# Patient Record
Sex: Female | Born: 1955 | Race: White | Hispanic: No | Marital: Married | Smoking: Never smoker
Health system: Southern US, Community
[De-identification: ages and names within clinical notes are randomized; demographics above are authoritative.]

## PROBLEM LIST (undated history)

## (undated) DIAGNOSIS — N6019 Diffuse cystic mastopathy of unspecified breast: Secondary | ICD-10-CM

## (undated) DIAGNOSIS — Z780 Asymptomatic menopausal state: Secondary | ICD-10-CM

## (undated) DIAGNOSIS — IMO0002 Reserved for concepts with insufficient information to code with codable children: Secondary | ICD-10-CM

## (undated) DIAGNOSIS — N6012 Diffuse cystic mastopathy of left breast: Secondary | ICD-10-CM

## (undated) DIAGNOSIS — I10 Essential (primary) hypertension: Secondary | ICD-10-CM

## (undated) DIAGNOSIS — M75100 Unspecified rotator cuff tear or rupture of unspecified shoulder, not specified as traumatic: Secondary | ICD-10-CM

## (undated) DIAGNOSIS — N950 Postmenopausal bleeding: Secondary | ICD-10-CM

## (undated) DIAGNOSIS — K5190 Ulcerative colitis, unspecified, without complications: Secondary | ICD-10-CM

## (undated) DIAGNOSIS — E785 Hyperlipidemia, unspecified: Secondary | ICD-10-CM

## (undated) DIAGNOSIS — R030 Elevated blood-pressure reading, without diagnosis of hypertension: Secondary | ICD-10-CM

## (undated) HISTORY — DX: Diffuse cystic mastopathy of unspecified breast: N60.19

## (undated) HISTORY — DX: Hyperlipidemia, unspecified: E78.5

## (undated) HISTORY — DX: Diffuse cystic mastopathy of left breast: N60.12

## (undated) HISTORY — DX: Reserved for concepts with insufficient information to code with codable children: IMO0002

## (undated) HISTORY — DX: Ulcerative colitis, unspecified, without complications: K51.90

## (undated) HISTORY — PX: BREAST SURGERY: SHX581

## (undated) HISTORY — DX: Elevated blood-pressure reading, without diagnosis of hypertension: R03.0

## (undated) HISTORY — PX: BREAST CYST ASPIRATION: SHX578

## (undated) HISTORY — PX: BREAST BIOPSY: SHX20

---

## 1997-08-04 ENCOUNTER — Other Ambulatory Visit: Admission: RE | Admit: 1997-08-04 | Discharge: 1997-08-04 | Payer: Self-pay | Admitting: Dermatology

## 1998-01-29 HISTORY — PX: OTHER SURGICAL HISTORY: SHX169

## 1998-08-09 ENCOUNTER — Other Ambulatory Visit: Admission: RE | Admit: 1998-08-09 | Discharge: 1998-08-09 | Payer: Self-pay | Admitting: Gynecology

## 1999-04-28 ENCOUNTER — Ambulatory Visit (HOSPITAL_COMMUNITY): Admission: RE | Admit: 1999-04-28 | Discharge: 1999-04-28 | Payer: Self-pay | Admitting: Gynecology

## 1999-04-28 ENCOUNTER — Encounter (INDEPENDENT_AMBULATORY_CARE_PROVIDER_SITE_OTHER): Payer: Self-pay | Admitting: Specialist

## 1999-05-18 ENCOUNTER — Other Ambulatory Visit: Admission: RE | Admit: 1999-05-18 | Discharge: 1999-05-18 | Payer: Self-pay | Admitting: Gynecology

## 1999-12-12 ENCOUNTER — Other Ambulatory Visit: Admission: RE | Admit: 1999-12-12 | Discharge: 1999-12-12 | Payer: Self-pay | Admitting: Gynecology

## 2000-11-27 ENCOUNTER — Other Ambulatory Visit: Admission: RE | Admit: 2000-11-27 | Discharge: 2000-11-27 | Payer: Self-pay | Admitting: Gynecology

## 2000-12-04 ENCOUNTER — Encounter: Payer: Self-pay | Admitting: Gynecology

## 2000-12-04 ENCOUNTER — Encounter: Admission: RE | Admit: 2000-12-04 | Discharge: 2000-12-04 | Payer: Self-pay | Admitting: Gynecology

## 2001-12-08 ENCOUNTER — Encounter: Payer: Self-pay | Admitting: Gynecology

## 2001-12-08 ENCOUNTER — Encounter: Admission: RE | Admit: 2001-12-08 | Discharge: 2001-12-08 | Payer: Self-pay | Admitting: Gynecology

## 2001-12-31 ENCOUNTER — Other Ambulatory Visit: Admission: RE | Admit: 2001-12-31 | Discharge: 2001-12-31 | Payer: Self-pay | Admitting: Gynecology

## 2002-09-16 ENCOUNTER — Ambulatory Visit (HOSPITAL_COMMUNITY): Admission: RE | Admit: 2002-09-16 | Discharge: 2002-09-16 | Payer: Self-pay | Admitting: Gastroenterology

## 2002-09-16 ENCOUNTER — Encounter (INDEPENDENT_AMBULATORY_CARE_PROVIDER_SITE_OTHER): Payer: Self-pay | Admitting: *Deleted

## 2002-11-06 ENCOUNTER — Other Ambulatory Visit: Admission: RE | Admit: 2002-11-06 | Discharge: 2002-11-06 | Payer: Self-pay | Admitting: Gynecology

## 2002-11-27 ENCOUNTER — Encounter: Admission: RE | Admit: 2002-11-27 | Discharge: 2002-11-27 | Payer: Self-pay | Admitting: Gynecology

## 2003-03-01 ENCOUNTER — Other Ambulatory Visit: Admission: RE | Admit: 2003-03-01 | Discharge: 2003-03-01 | Payer: Self-pay | Admitting: Gynecology

## 2003-12-14 ENCOUNTER — Encounter: Admission: RE | Admit: 2003-12-14 | Discharge: 2003-12-14 | Payer: Self-pay | Admitting: Gynecology

## 2004-03-21 ENCOUNTER — Other Ambulatory Visit: Admission: RE | Admit: 2004-03-21 | Discharge: 2004-03-21 | Payer: Self-pay | Admitting: Gynecology

## 2004-05-09 ENCOUNTER — Other Ambulatory Visit: Admission: RE | Admit: 2004-05-09 | Discharge: 2004-05-09 | Payer: Self-pay | Admitting: Gynecology

## 2004-09-12 ENCOUNTER — Other Ambulatory Visit: Admission: RE | Admit: 2004-09-12 | Discharge: 2004-09-12 | Payer: Self-pay | Admitting: Gynecology

## 2005-01-31 ENCOUNTER — Encounter: Admission: RE | Admit: 2005-01-31 | Discharge: 2005-01-31 | Payer: Self-pay | Admitting: Gynecology

## 2005-02-21 ENCOUNTER — Encounter: Admission: RE | Admit: 2005-02-21 | Discharge: 2005-02-21 | Payer: Self-pay | Admitting: Gynecology

## 2005-04-13 ENCOUNTER — Other Ambulatory Visit: Admission: RE | Admit: 2005-04-13 | Discharge: 2005-04-13 | Payer: Self-pay | Admitting: Gynecology

## 2006-04-30 ENCOUNTER — Encounter: Admission: RE | Admit: 2006-04-30 | Discharge: 2006-04-30 | Payer: Self-pay | Admitting: Gynecology

## 2006-08-23 ENCOUNTER — Other Ambulatory Visit: Admission: RE | Admit: 2006-08-23 | Discharge: 2006-08-23 | Payer: Self-pay | Admitting: Gynecology

## 2006-10-07 ENCOUNTER — Encounter: Admission: RE | Admit: 2006-10-07 | Discharge: 2006-10-07 | Payer: Self-pay | Admitting: Gynecology

## 2006-10-31 ENCOUNTER — Other Ambulatory Visit: Admission: RE | Admit: 2006-10-31 | Discharge: 2006-10-31 | Payer: Self-pay | Admitting: Gynecology

## 2007-07-25 ENCOUNTER — Encounter (INDEPENDENT_AMBULATORY_CARE_PROVIDER_SITE_OTHER): Payer: Self-pay | Admitting: Diagnostic Radiology

## 2007-07-25 ENCOUNTER — Encounter: Admission: RE | Admit: 2007-07-25 | Discharge: 2007-07-25 | Payer: Self-pay | Admitting: Gynecology

## 2008-02-26 ENCOUNTER — Ambulatory Visit: Payer: Self-pay | Admitting: Internal Medicine

## 2008-10-20 ENCOUNTER — Encounter: Admission: RE | Admit: 2008-10-20 | Discharge: 2008-10-20 | Payer: Self-pay | Admitting: Gynecology

## 2009-08-05 ENCOUNTER — Ambulatory Visit: Payer: Self-pay | Admitting: Unknown Physician Specialty

## 2010-02-15 ENCOUNTER — Encounter
Admission: RE | Admit: 2010-02-15 | Discharge: 2010-02-15 | Payer: Self-pay | Source: Home / Self Care | Attending: Gynecology | Admitting: Gynecology

## 2010-02-19 ENCOUNTER — Encounter: Payer: Self-pay | Admitting: Gynecology

## 2010-06-16 NOTE — Op Note (Signed)
   NAME:  Victoria Shelton, Victoria Shelton                            ACCOUNT NO.:  [id number]   MEDICAL RECORD NO.:  [id number]                   PATIENT TYPE:  AMB   LOCATION:  ENDO                                 FACILITY:  [facility]   PHYSICIAN:  Graylin Shiver, M.D.                DATE OF BIRTH:  Sep 07, 1955   DATE OF PROCEDURE:  09/16/2002  DATE OF DISCHARGE:                                 OPERATIVE REPORT   PROCEDURE:  Colonoscopy with biopsy.   INDICATIONS FOR PROCEDURE:  Change in bowel habits, rectal bleeding.   Informed consent was obtained after explanation of the risks of bleeding,  infection and perforation.   PREMEDICATION:  Fentanyl 62.5 mcg IV, Versed 5 mg IV.   DESCRIPTION OF PROCEDURE:  With the patient in the left lateral decubitus  position, a rectal exam was performed and no masses were felt. The Olympus  colonoscope was then inserted into the rectum and advanced around the colon  to the cecum. Cecal landmarks were identified. The cecum and ascending colon  were normal. The transverse colon was normal.  The descending colon was  normal. In the distal sigmoid, there was a small 3 mm sessile polyp removed  with cold forceps. In the rectum extending up approximately 8 cm of the  distal rectum was inflammation and ulceration compatible with proctitis,  biopsies were obtained. The patient tolerated the procedure well without  complications.   IMPRESSION:  1. Ulcerative proctitis.  2. Small sigmoid polyp.   PLAN:  The biopsies will be checked. The patient will be given a  prescription for Canasa suppositories, one b.i.d. and asked to followup in  the office to see how she is doing clinically in the next few weeks.                                               Graylin Shiver, M.D.    Germain Osgood  D:  09/16/2002  T:  09/16/2002  Job:  [id number]   cc:   Gretta Cool, M.D.  [address] [place]  [place] [address]  Fax: [phone]

## 2010-07-12 ENCOUNTER — Other Ambulatory Visit: Payer: Self-pay | Admitting: Gynecology

## 2011-03-23 ENCOUNTER — Other Ambulatory Visit: Payer: Self-pay | Admitting: Gynecology

## 2011-03-23 DIAGNOSIS — N63 Unspecified lump in unspecified breast: Secondary | ICD-10-CM

## 2011-04-12 ENCOUNTER — Ambulatory Visit
Admission: RE | Admit: 2011-04-12 | Discharge: 2011-04-12 | Disposition: A | Discharge status: Home / Self Care | Payer: Managed Care, Other (non HMO) | Source: Ambulatory Visit | Attending: Gynecology | Admitting: Gynecology

## 2011-04-12 ENCOUNTER — Other Ambulatory Visit: Payer: Self-pay | Admitting: Gynecology

## 2011-04-12 DIAGNOSIS — N63 Unspecified lump in unspecified breast: Secondary | ICD-10-CM

## 2011-09-26 ENCOUNTER — Other Ambulatory Visit: Payer: Self-pay | Admitting: Gynecology

## 2012-07-15 ENCOUNTER — Other Ambulatory Visit: Payer: Self-pay

## 2012-07-15 DIAGNOSIS — Z1231 Encounter for screening mammogram for malignant neoplasm of breast: Secondary | ICD-10-CM

## 2012-08-13 ENCOUNTER — Ambulatory Visit
Admission: RE | Admit: 2012-08-13 | Discharge: 2012-08-13 | Disposition: A | Discharge status: Home / Self Care | Payer: Managed Care, Other (non HMO) | Source: Ambulatory Visit

## 2012-08-13 DIAGNOSIS — Z1231 Encounter for screening mammogram for malignant neoplasm of breast: Secondary | ICD-10-CM

## 2012-12-11 LAB — HM PAP SMEAR: HM Pap smear: NEGATIVE

## 2013-10-07 ENCOUNTER — Other Ambulatory Visit: Payer: Self-pay

## 2013-10-07 DIAGNOSIS — Z1231 Encounter for screening mammogram for malignant neoplasm of breast: Secondary | ICD-10-CM

## 2013-10-19 ENCOUNTER — Ambulatory Visit: Payer: Managed Care, Other (non HMO)

## 2013-11-05 ENCOUNTER — Ambulatory Visit
Admission: RE | Admit: 2013-11-05 | Discharge: 2013-11-05 | Disposition: A | Discharge status: Home / Self Care | Payer: Managed Care, Other (non HMO) | Source: Ambulatory Visit

## 2013-11-05 DIAGNOSIS — Z1231 Encounter for screening mammogram for malignant neoplasm of breast: Secondary | ICD-10-CM

## 2013-11-10 ENCOUNTER — Other Ambulatory Visit: Payer: Self-pay | Admitting: Internal Medicine

## 2013-11-10 DIAGNOSIS — R928 Other abnormal and inconclusive findings on diagnostic imaging of breast: Secondary | ICD-10-CM

## 2013-11-23 ENCOUNTER — Ambulatory Visit
Admission: RE | Admit: 2013-11-23 | Discharge: 2013-11-23 | Disposition: A | Discharge status: Home / Self Care | Payer: Managed Care, Other (non HMO) | Source: Ambulatory Visit | Attending: Internal Medicine | Admitting: Internal Medicine

## 2013-11-23 ENCOUNTER — Other Ambulatory Visit: Payer: Self-pay | Admitting: Internal Medicine

## 2013-11-23 DIAGNOSIS — N6310 Unspecified lump in the right breast, unspecified quadrant: Secondary | ICD-10-CM

## 2013-11-23 DIAGNOSIS — R928 Other abnormal and inconclusive findings on diagnostic imaging of breast: Secondary | ICD-10-CM

## 2013-11-30 ENCOUNTER — Ambulatory Visit
Admission: RE | Admit: 2013-11-30 | Discharge: 2013-11-30 | Disposition: A | Discharge status: Home / Self Care | Payer: Managed Care, Other (non HMO) | Source: Ambulatory Visit | Attending: Internal Medicine | Admitting: Internal Medicine

## 2013-11-30 ENCOUNTER — Other Ambulatory Visit: Payer: Self-pay | Admitting: Internal Medicine

## 2013-11-30 DIAGNOSIS — N6310 Unspecified lump in the right breast, unspecified quadrant: Secondary | ICD-10-CM

## 2015-02-10 ENCOUNTER — Ambulatory Visit (INDEPENDENT_AMBULATORY_CARE_PROVIDER_SITE_OTHER): Payer: Managed Care, Other (non HMO) | Admitting: Obstetrics and Gynecology

## 2015-02-10 ENCOUNTER — Encounter: Payer: Self-pay | Admitting: Obstetrics and Gynecology

## 2015-02-10 ENCOUNTER — Other Ambulatory Visit: Payer: Self-pay | Admitting: Obstetrics and Gynecology

## 2015-02-10 VITALS — BP 121/70 | HR 73 | Ht 64.0 in | Wt 143.7 lb

## 2015-02-10 DIAGNOSIS — Z8262 Family history of osteoporosis: Secondary | ICD-10-CM | POA: Diagnosis not present

## 2015-02-10 DIAGNOSIS — Z01419 Encounter for gynecological examination (general) (routine) without abnormal findings: Secondary | ICD-10-CM

## 2015-02-10 DIAGNOSIS — M75100 Unspecified rotator cuff tear or rupture of unspecified shoulder, not specified as traumatic: Secondary | ICD-10-CM | POA: Insufficient documentation

## 2015-02-10 DIAGNOSIS — Z1211 Encounter for screening for malignant neoplasm of colon: Secondary | ICD-10-CM | POA: Diagnosis not present

## 2015-02-10 DIAGNOSIS — Z8669 Personal history of other diseases of the nervous system and sense organs: Secondary | ICD-10-CM | POA: Insufficient documentation

## 2015-02-10 DIAGNOSIS — K5190 Ulcerative colitis, unspecified, without complications: Secondary | ICD-10-CM | POA: Insufficient documentation

## 2015-02-10 DIAGNOSIS — E785 Hyperlipidemia, unspecified: Secondary | ICD-10-CM | POA: Insufficient documentation

## 2015-02-10 DIAGNOSIS — Z780 Asymptomatic menopausal state: Secondary | ICD-10-CM

## 2015-02-10 DIAGNOSIS — Z1239 Encounter for other screening for malignant neoplasm of breast: Secondary | ICD-10-CM

## 2015-02-10 DIAGNOSIS — I10 Essential (primary) hypertension: Secondary | ICD-10-CM | POA: Insufficient documentation

## 2015-02-10 MED ORDER — ESTRADIOL 0.05 MG/24HR TD PTTW: 1.0000 | MEDICATED_PATCH | TRANSDERMAL | Status: DC

## 2015-02-10 MED ORDER — PROGESTERONE MICRONIZED 200 MG PO CAPS: 200.0000 mg | ORAL_CAPSULE | Freq: Every day | ORAL | Status: DC

## 2015-02-10 NOTE — Progress Notes (Signed)
Patient ID: Victoria Shelton, female   DOB: 1955-08-28, 60 y.o.   MRN: [id number] ANNUAL PREVENTATIVE CARE GYN  ENCOUNTER NOTE  Subjective:       Victoria Shelton is a 60 y.o. 101P1001 female here for a routine annual gynecologic exam.  Current complaints: 1.  none  2.  Menopausal state; asymptomatic on HRT therapy 3.  Family history of osteoporosis   Gynecologic History No LMP recorded. Patient is postmenopausal. Contraception: post menopausal status Last Pap: 12/09/2012 neg/neg.  Last mammogram: 2015 neg- thru pcp. Results were: normal HRT: Vivelle Dot 0.025 mg biweekly; Prometrium 200 mg daily at bedtime  Obstetric History OB History  Gravida Para Term Preterm AB SAB TAB Ectopic Multiple Living  1 1 1       1     # Outcome Date GA Lbr Len/2nd Weight Sex Delivery Anes PTL Lv  1 Term 1979   7 lb 1.8 oz (3.225 kg) F Vag-Spont   Y      Past Medical History  Diagnosis Date  . Fibrocystic breast     left  . Hyperlipemia   . Borderline hypertension     Past Surgical History  Procedure Laterality Date  . Ablation  2000    endometrial  . Breast surgery      breast bx- all neg    Current Outpatient Prescriptions on File Prior to Visit  Medication Sig Dispense Refill  . Calcium Carbonate-Vit D-Min (CALCIUM 600+D PLUS MINERALS) 600-400 MG-UNIT CHEW Chew by mouth.    . estradiol (VIVELLE-DOT) 0.05 MG/24HR patch Place onto the skin.    . progesterone (PROMETRIUM) 200 MG capsule Take by mouth.     No current facility-administered medications on file prior to visit.    No Known Allergies  Social History   Social History  . Marital Status: Married    Spouse Name: N/A  . Number of Children: N/A  . Years of Education: N/A   Occupational History  . Not on file.   Social History Main Topics  . Smoking status: Never Smoker   . Smokeless tobacco: Not on file  . Alcohol Use: Yes     Comment: rare  . Drug Use: No  . Sexual Activity: Yes    Birth Control/ Protection:  Post-menopausal   Other Topics Concern  . Not on file   Social History Narrative    Family History  Problem Relation Age of Onset  . COPD Mother   . Stroke Mother   . Osteoporosis Mother   . Liver cancer Father   . Cancer Neg Hx   . Diabetes Neg Hx     The following portions of the patient's history were reviewed and updated as appropriate: allergies, current medications, past family history, past medical history, past social history, past surgical history and problem list.  Review of Systems ROS Review of Systems - General ROS: negative for - chills, fatigue, fever, hot flashes, night sweats, weight gain or weight loss Psychological ROS: negative for - anxiety, decreased libido, depression, mood swings, physical abuse or sexual abuse Ophthalmic ROS: negative for - blurry vision, eye pain or loss of vision ENT ROS: negative for - headaches, hearing change, visual changes or vocal changes Allergy and Immunology ROS: negative for - hives, itchy/watery eyes or seasonal allergies Hematological and Lymphatic ROS: negative for - bleeding problems, bruising, swollen lymph nodes or weight loss Endocrine ROS: negative for - galactorrhea, hair pattern changes, hot flashes, malaise/lethargy, mood swings, palpitations, polydipsia/polyuria, skin changes, temperature  intolerance or unexpected weight changes Breast ROS: negative for - new or changing breast lumps or nipple discharge Respiratory ROS: negative for - cough or shortness of breath Cardiovascular ROS: negative for - chest pain, irregular heartbeat, palpitations or shortness of breath Gastrointestinal ROS: no abdominal pain, change in bowel habits, or black or bloody stools Genito-Urinary ROS: no dysuria, trouble voiding, or hematuria Musculoskeletal ROS: negative for - joint pain or joint stiffness Neurological ROS: negative for - bowel and bladder control changes Dermatological ROS: negative for rash and skin lesion changes    Objective:   BP 121/70 mmHg  Pulse 73  Ht 5\' 4"  (1.626 m)  Wt 143 lb 11.2 oz (65.182 kg)  BMI 24.65 kg/m2 CONSTITUTIONAL: Well-developed, well-nourished female in no acute distress.  PSYCHIATRIC: Normal mood and affect. Normal behavior. Normal judgment and thought content. NEUROLGIC: Alert and oriented to person, place, and time. Normal muscle tone coordination. No cranial nerve deficit noted. HENT:  Normocephalic, atraumatic, External right and left ear normal. Oropharynx is clear and moist EYES: Conjunctivae and EOM are normal. Pupils are equal, round, and reactive to light. No scleral icterus.  NECK: Normal range of motion, supple, no masses.  Normal thyroid.  SKIN: Skin is warm and dry. No rash noted. Not diaphoretic. No erythema. No pallor. CARDIOVASCULAR: Normal heart rate noted, regular rhythm, no murmur. RESPIRATORY: Clear to auscultation bilaterally. Effort and breath sounds normal, no problems with respiration noted. BREASTS: Symmetric in size. No masses, skin changes, nipple drainage, or lymphadenopathy. ABDOMEN: Soft, normal bowel sounds, no distention noted.  No tenderness, rebound or guarding.  BLADDER: Normal PELVIC:  External Genitalia: Normal  BUS: Normal  Vagina: Normal  Cervix: Normal  Uterus: Normal; Midplane, normal size, shape, mobile, nontender  Adnexa: Normal  RV: External Exam NormaI, No Rectal Masses and Normal Sphincter tone  MUSCULOSKELETAL: Normal range of motion. No tenderness.  No cyanosis, clubbing, or edema.  2+ distal pulses. LYMPHATIC: No Axillary, Supraclavicular, or Inguinal Adenopathy.    Assessment:   Annual gynecologic examination 60 y.o. Contraception: post menopausal status  Asymptomatic on HRT therapy.  Vivelle-Dot 0.025 mg patch biweekly; Prometrium 200 mg daily at bedtime bmi-24 Family history osteoporosis  Plan:  Pap: Pap Co Test Mammogram: Ordered Stool Guaiac Testing:  Ordered Labs: thru pcp Routine preventative health  maintenance measures emphasized: Exercise/Diet/Weight control, Tobacco Warnings, Alcohol/Substance use risks and Stress Management  Continue calcium and vitamin D supplementation daily Refill Vivelle-Dot and Prometrium Return to Clinic - 1 Year   [employer], CMA  Herold Harms, MD  Note: This dictation was prepared with Dragon dictation along with smaller phrase technology. Any transcriptional errors that result from this process are unintentional.

## 2015-02-10 NOTE — Patient Instructions (Signed)
1.  Pap smear is completed today. 2.  Mammogram is ordered. 3.  Stool guaiac cards are given for colon cancer screening. 4.  Screening lab work is done through primary care provider. 5.  Vivelle-Dot 0.025 mg patch and Prometrium 200 mg are refilled. 6.  Continue with calcium and vitamin D supplementation. 7.  Continue with weightbearing exercise. 8.  Return in one year for annual exam

## 2015-02-16 LAB — PAP IG AND HPV HIGH-RISK
HPV, high-risk: NEGATIVE
PAP Smear Comment: 0

## 2015-02-17 ENCOUNTER — Telehealth: Payer: Self-pay | Admitting: Obstetrics and Gynecology

## 2015-02-17 MED ORDER — PROGESTERONE MICRONIZED 200 MG PO CAPS: 200.0000 mg | ORAL_CAPSULE | Freq: Every day | ORAL | Status: DC

## 2015-02-17 NOTE — Telephone Encounter (Signed)
Question about progesterone?  She saw dr Tommi Rumps yesterday.

## 2015-02-17 NOTE — Telephone Encounter (Signed)
Pt aware rx sent in wrong- spoke with mad- pt is to take progesterone 200 days 1-12 q 3 months. Rx sent in correctly.

## 2015-04-04 ENCOUNTER — Other Ambulatory Visit: Payer: Self-pay | Admitting: Obstetrics and Gynecology

## 2015-05-16 ENCOUNTER — Ambulatory Visit: Payer: Managed Care, Other (non HMO)

## 2015-05-19 ENCOUNTER — Ambulatory Visit
Admission: RE | Admit: 2015-05-19 | Discharge: 2015-05-19 | Disposition: A | Discharge status: Home / Self Care | Payer: Managed Care, Other (non HMO) | Source: Ambulatory Visit | Attending: Obstetrics and Gynecology | Admitting: Obstetrics and Gynecology

## 2015-05-19 DIAGNOSIS — Z1239 Encounter for other screening for malignant neoplasm of breast: Secondary | ICD-10-CM

## 2015-05-20 ENCOUNTER — Other Ambulatory Visit: Payer: Self-pay | Admitting: Obstetrics and Gynecology

## 2015-05-20 DIAGNOSIS — R928 Other abnormal and inconclusive findings on diagnostic imaging of breast: Secondary | ICD-10-CM

## 2015-05-31 ENCOUNTER — Ambulatory Visit
Admission: RE | Admit: 2015-05-31 | Discharge: 2015-05-31 | Disposition: A | Discharge status: Home / Self Care | Payer: Managed Care, Other (non HMO) | Source: Ambulatory Visit | Attending: Obstetrics and Gynecology | Admitting: Obstetrics and Gynecology

## 2015-05-31 DIAGNOSIS — R928 Other abnormal and inconclusive findings on diagnostic imaging of breast: Secondary | ICD-10-CM

## 2015-10-25 ENCOUNTER — Emergency Department
Admission: EM | Admit: 2015-10-25 | Discharge: 2015-10-25 | Disposition: A | Discharge status: Home / Self Care | Payer: Managed Care, Other (non HMO) | Attending: Emergency Medicine | Admitting: Emergency Medicine

## 2015-10-25 ENCOUNTER — Encounter: Payer: Self-pay | Admitting: Medical Oncology

## 2015-10-25 DIAGNOSIS — K51911 Ulcerative colitis, unspecified with rectal bleeding: Secondary | ICD-10-CM | POA: Insufficient documentation

## 2015-10-25 DIAGNOSIS — R109 Unspecified abdominal pain: Secondary | ICD-10-CM | POA: Diagnosis present

## 2015-10-25 LAB — COMPREHENSIVE METABOLIC PANEL
ALBUMIN: 4.9 g/dL (ref 3.5–5.0)
ALK PHOS: 51 U/L (ref 38–126)
ALT: 16 U/L (ref 14–54)
AST: 20 U/L (ref 15–41)
Anion gap: 8 (ref 5–15)
BUN: 17 mg/dL (ref 6–20)
CO2: 28 mmol/L (ref 22–32)
CREATININE: 0.94 mg/dL (ref 0.44–1.00)
Calcium: 9.8 mg/dL (ref 8.9–10.3)
Chloride: 99 mmol/L — ABNORMAL LOW (ref 101–111)
GFR calc Af Amer: >60 mL/min (ref >60)
GLUCOSE: 101 mg/dL — AB (ref 65–99)
Potassium: 3.4 mmol/L — ABNORMAL LOW (ref 3.5–5.1)
Sodium: 135 mmol/L (ref 135–145)
Total Bilirubin: 1.1 mg/dL (ref 0.3–1.2)
Total Protein: 8.3 g/dL — ABNORMAL HIGH (ref 6.5–8.1)

## 2015-10-25 LAB — CBC
HEMATOCRIT: 43.7 % (ref 35.0–47.0)
Hemoglobin: 15 g/dL (ref 12.0–16.0)
MCH: 30.6 pg (ref 26.0–34.0)
MCHC: 34.5 g/dL (ref 32.0–36.0)
MCV: 88.7 fL (ref 80.0–100.0)
PLATELETS: 289 10*3/uL (ref 150–440)
RBC: 4.92 MIL/uL (ref 3.80–5.20)
RDW: 12.8 % (ref 11.5–14.5)
WBC: 9.5 10*3/uL (ref 3.6–11.0)

## 2015-10-25 LAB — LIPASE, BLOOD: Lipase: 42 U/L (ref 11–51)

## 2015-10-25 MED ORDER — PREDNISONE 20 MG PO TABS
60.0000 mg | ORAL_TABLET | Freq: Once | ORAL | Status: AC
  Administered 2015-10-25: 60 mg via ORAL

## 2015-10-25 MED ORDER — METHYLPREDNISOLONE 4 MG PO TABS: 4.0000 mg | ORAL_TABLET | Freq: Every day | ORAL | 0 refills | Status: DC

## 2015-10-25 MED ORDER — ONDANSETRON HCL 4 MG PO TABS: 4.0000 mg | ORAL_TABLET | Freq: Every day | ORAL | 0 refills | Status: DC | PRN

## 2015-10-25 MED ORDER — PREDNISONE 20 MG PO TABS
ORAL_TABLET | ORAL | Status: AC
  Filled 2015-10-25: qty 3

## 2015-10-25 NOTE — ED Provider Notes (Signed)
[facility] Emergency Department Provider Note   ____________________________________________   First MD Initiated Contact with Patient 10/25/15 1659     (approximate)  I have reviewed the triage vital signs and the nursing notes.   HISTORY  Chief Complaint Abdominal Pain   HPI Victoria Shelton is a 60 y.o. female with a history of ulcerative colitis who is presenting to the emergency department today with 3 weeks of abdominal cramping as well as bloody stools. She says that 3-4 weeks ago she contracted an upper respiratory infection which resolved. She thinks she had contracted from small children. However, after the upper respiratory symptoms stated, she began having abdominal cramping and today has been nauseous all day and has not eaten anything. She says that she had a bloody bowel movement yesterday with bright red blood mixed in with the stool. Denies any pain right now but says that the pain mostly comes to her midabdomen right before she needs to move her bowels. She has not on any immunosuppressive at this time. Says that her ulcerative colitis symptoms had waned over time and she was therefore weaned off her medications.She is not a gastroenterologist. She was sent here by Dr. Judithann Sheen' office.   Past Medical History:  Diagnosis Date  . Borderline hypertension   . Fibrocystic breast    left  . Hyperlipemia     Patient Active Problem List   Diagnosis Date Noted  . Benign hypertension 02/10/2015  . History of migraine headaches 02/10/2015  . HLD (hyperlipidemia) 02/10/2015  . Rotator cuff syndrome 02/10/2015  . Ulcerative colitis (HCC) 02/10/2015  . Menopause 02/10/2015  . Family history of osteoporosis 02/10/2015    Past Surgical History:  Procedure Laterality Date  . ablation  2000   endometrial  . BREAST SURGERY     breast bx- all neg    Prior to Admission medications   Medication Sig Start Date End Date Taking? Authorizing Provider    Calcium Carbonate-Vit D-Min (CALCIUM 600+D PLUS MINERALS) 600-400 MG-UNIT CHEW Chew by mouth.    Historical Provider, MD  chlorthalidone (HYGROTON) 25 MG tablet Take by mouth. 07/30/14   Historical Provider, MD  estradiol (VIVELLE-DOT) 0.05 MG/24HR patch Place 1 patch (0.05 mg total) onto the skin 2 (two) times a week. 02/10/15   Prentice Docker Defrancesco, MD  meloxicam (MOBIC) 15 MG tablet Take by mouth. 07/30/14   Historical Provider, MD  progesterone (PROMETRIUM) 200 MG capsule Take 1 capsule (200 mg total) by mouth daily. Take for the first 12 days of each 3rd  month. 02/17/15   Prentice Docker Defrancesco, MD    Allergies Review of patient's allergies indicates no known allergies.  Family History  Problem Relation Age of Onset  . COPD Mother   . Stroke Mother   . Osteoporosis Mother   . Liver cancer Father   . Cancer Neg Hx   . Diabetes Neg Hx     Social History Social History  Substance Use Topics  . Smoking status: Never Smoker  . Smokeless tobacco: Not on file  . Alcohol use Yes     Comment: rare    Review of Systems Constitutional: No fever/chills Eyes: No visual changes. ENT: No sore throat. Cardiovascular: Denies chest pain. Respiratory: Denies shortness of breath. Gastrointestinal:  no vomiting.   No constipation. Genitourinary: Negative for dysuria. Musculoskeletal: Negative for back pain. Skin: Negative for rash. Neurological: Negative for headaches, focal weakness or numbness.  10-point ROS otherwise negative.  ____________________________________________  PHYSICAL EXAM:  VITAL SIGNS: ED Triage Vitals  Enc Vitals Group     BP 10/25/15 1549 (!) 153/93     Pulse Rate 10/25/15 1549 100     Resp 10/25/15 1549 19     Temp 10/25/15 1549 98.3 F (36.8 C)     Temp Source 10/25/15 1549 Oral     SpO2 10/25/15 1549 97 %     Weight 10/25/15 1550 147 lb (66.7 kg)     Height 10/25/15 1550 5\' 4"  (1.626 m)     Head Circumference --      Peak Flow --      Pain Score  10/25/15 1713 0     Pain Loc --      Pain Edu? --      Excl. in GC? --     Constitutional: Alert and oriented. Well appearing and in no acute distress. Eyes: Conjunctivae are normal. PERRL. EOMI. Head: Atraumatic. Nose: No congestion/rhinnorhea. Mouth/Throat: Mucous membranes are moist.   Neck: No stridor.   Cardiovascular: Normal rate, regular rhythm. Grossly normal heart sounds.   Respiratory: Normal respiratory effort.  No retractions. Lungs CTAB. Gastrointestinal: Soft and nontender. No distention. No CVA tenderness.  Rectal exam with brown stool which is moderately heme positive. Musculoskeletal: No lower extremity tenderness nor edema.  No joint effusions. Neurologic:  Normal speech and language. No gross focal neurologic deficits are appreciated. No gait instability. Skin:  Skin is warm, dry and intact. No rash noted. Psychiatric: Mood and affect are normal. Speech and behavior are normal.  ____________________________________________   LABS (all labs ordered are listed, but only abnormal results are displayed)  Labs Reviewed  COMPREHENSIVE METABOLIC PANEL - Abnormal; Notable for the following:       Result Value   Potassium 3.4 (*)    Chloride 99 (*)    Glucose, Bld 101 (*)    Total Protein 8.3 (*)    All other components within normal limits  LIPASE, BLOOD  CBC  URINALYSIS COMPLETEWITH MICROSCOPIC ([facility] ONLY)   ____________________________________________  EKG   ____________________________________________  RADIOLOGY   ____________________________________________   PROCEDURES  Procedure(s) performed:   Procedures  Critical Care performed:   ____________________________________________   INITIAL IMPRESSION / ASSESSMENT AND PLAN / ED COURSE  Pertinent labs & imaging results that were available during my care of the patient were reviewed by me and considered in my medical decision making (see chart for  details).  ----------------------------------------- 6:12 PM on 10/25/2015 -----------------------------------------  Discussed the case with Dr. Shawnie DapperGanum who [facility] recommends a short steroid course. He also recommends follow-up with gastroenterology. I also asked about antibiotics and he agrees that he does not feel they're necessary at this time.  I explained the plan to the patient to start the steroid course as well as to go home with a prescription for antiemetics. She'll be following up with gastroenterology. She is understanding of the plan and willing to comply. The patient's condition is very benign at this time and she had no abdominal tenderness and a normal white count as well as hemoglobin. I believe she is appropriate for outpatient treatment.  Clinical Course     ____________________________________________   FINAL CLINICAL IMPRESSION(S) / ED DIAGNOSES  Ulcerative colitis flare.    NEW MEDICATIONS STARTED DURING THIS VISIT:  New Prescriptions   No medications on file     Note:  This document was prepared using Dragon voice recognition software and may include unintentional dictation  errors.    Myrna Blazer, MD 10/25/15 [phone]

## 2015-10-25 NOTE — ED Notes (Addendum)
Per pt Dr. Judithann SheenSparks told pt to come to ED for stomach issues x 2 weeks. Pt states bleeding from rectum, states light a few weeks ago, but much worse last night.   Pt states dx with UC years ago and wondering if this is a flare up.

## 2015-10-25 NOTE — ED Triage Notes (Signed)
Pt reports she has been having diarrhea x 3 weeks, has a hx of UC. Pt reports she noted last night there was blood in her stool. Pt reports having some nausea but no pain at this time.

## 2015-11-03 ENCOUNTER — Other Ambulatory Visit
Admission: RE | Admit: 2015-11-03 | Discharge: 2015-11-03 | Disposition: A | Discharge status: Home / Self Care | Payer: Managed Care, Other (non HMO) | Source: Ambulatory Visit | Attending: Nurse Practitioner | Admitting: Nurse Practitioner

## 2015-11-03 DIAGNOSIS — R197 Diarrhea, unspecified: Secondary | ICD-10-CM | POA: Diagnosis not present

## 2015-11-03 LAB — C DIFFICILE QUICK SCREEN W PCR REFLEX
C Diff antigen: NEGATIVE
C Diff interpretation: NOT DETECTED
C Diff toxin: NEGATIVE

## 2015-11-03 LAB — GASTROINTESTINAL PANEL BY PCR, STOOL (REPLACES STOOL CULTURE)

## 2016-03-22 ENCOUNTER — Encounter: Payer: Managed Care, Other (non HMO) | Admitting: Obstetrics and Gynecology

## 2016-03-30 NOTE — Progress Notes (Deleted)
Patient ID: Victoria Shelton, female   DOB: August 20, 1955, 61 y.o.   MRN: [id number] ANNUAL PREVENTATIVE CARE GYN  ENCOUNTER NOTE  Subjective:       Victoria Shelton is a 61 y.o. 771P1001 female here for a routine annual gynecologic exam.  Current complaints: 1.  none  2.  Menopausal state; asymptomatic on HRT therapy 3.  Family history of osteoporosis   Gynecologic History No LMP recorded. Patient is postmenopausal. Contraception: post menopausal status Last Pap: 02/10/2015 neg/neg.  Last mammogram:4//20/217 birad 0, 05/31/2015 dx left and u/s- simple breast cyst- birad 2 Results were: normal HRT: Vivelle Dot 0.025 mg biweekly; Prometrium 200 mg daily at bedtime  Obstetric History OB History  Gravida Para Term Preterm AB Living  1 1 1     1   SAB TAB Ectopic Multiple Live Births          1    # Outcome Date GA Lbr Len/2nd Weight Sex Delivery Anes PTL Lv  1 Term 1979   7 lb 1.8 oz (3.225 kg) F Vag-Spont   LIV      Past Medical History:  Diagnosis Date  . Borderline hypertension   . Fibrocystic breast    left  . Hyperlipemia     Past Surgical History:  Procedure Laterality Date  . ablation  2000   endometrial  . BREAST SURGERY     breast bx- all neg    Current Outpatient Prescriptions on File Prior to Visit  Medication Sig Dispense Refill  . Calcium Carbonate-Vit D-Min (CALCIUM 600+D PLUS MINERALS) 600-400 MG-UNIT CHEW Chew by mouth.    . chlorthalidone (HYGROTON) 25 MG tablet Take by mouth.    . estradiol (VIVELLE-DOT) 0.05 MG/24HR patch Place 1 patch (0.05 mg total) onto the skin 2 (two) times a week. 24 patch 4  . meloxicam (MOBIC) 15 MG tablet Take by mouth.    . methylPREDNISolone (MEDROL) 4 MG tablet Take 1 tablet (4 mg total) by mouth daily. Dosepak taper.  Follow directions on packaging 21 tablet 0  . ondansetron (ZOFRAN) 4 MG tablet Take 1 tablet (4 mg total) by mouth daily as needed. 10 tablet 0  . progesterone (PROMETRIUM) 200 MG capsule Take 1 capsule (200 mg total) by  mouth daily. Take for the first 12 days of each 3rd  month. 48 capsule 0   No current facility-administered medications on file prior to visit.     No Known Allergies  Social History   Social History  . Marital status: Married    Spouse name: N/A  . Number of children: N/A  . Years of education: N/A   Occupational History  . Not on file.   Social History Main Topics  . Smoking status: Never Smoker  . Smokeless tobacco: Not on file  . Alcohol use Yes     Comment: rare  . Drug use: No  . Sexual activity: Yes    Birth control/ protection: Post-menopausal   Other Topics Concern  . Not on file   Social History Narrative  . No narrative on file    Family History  Problem Relation Age of Onset  . COPD Mother   . Stroke Mother   . Osteoporosis Mother   . Liver cancer Father   . Cancer Neg Hx   . Diabetes Neg Hx     The following portions of the patient's history were reviewed and updated as appropriate: allergies, current medications, past family history, past medical history, past social history,  past surgical history and problem list.  Review of Systems ROS   Objective:   There were no vitals taken for this visit. CONSTITUTIONAL: Well-developed, well-nourished female in no acute distress.  PSYCHIATRIC: Normal mood and affect. Normal behavior. Normal judgment and thought content. NEUROLGIC: Alert and oriented to person, place, and time. Normal muscle tone coordination. No cranial nerve deficit noted. HENT:  Normocephalic, atraumatic, External right and left ear normal. Oropharynx is clear and moist EYES: Conjunctivae and EOM are normal. Pupils are equal, round, and reactive to light. No scleral icterus.  NECK: Normal range of motion, supple, no masses.  Normal thyroid.  SKIN: Skin is warm and dry. No rash noted. Not diaphoretic. No erythema. No pallor. CARDIOVASCULAR: Normal heart rate noted, regular rhythm, no murmur. RESPIRATORY: Clear to auscultation  bilaterally. Effort and breath sounds normal, no problems with respiration noted. BREASTS: Symmetric in size. No masses, skin changes, nipple drainage, or lymphadenopathy. ABDOMEN: Soft, normal bowel sounds, no distention noted.  No tenderness, rebound or guarding.  BLADDER: Normal PELVIC:  External Genitalia: Normal  BUS: Normal  Vagina: Normal  Cervix: Normal  Uterus: Normal; Midplane, normal size, shape, mobile, nontender  Adnexa: Normal  RV: External Exam NormaI, No Rectal Masses and Normal Sphincter tone  MUSCULOSKELETAL: Normal range of motion. No tenderness.  No cyanosis, clubbing, or edema.  2+ distal pulses. LYMPHATIC: No Axillary, Supraclavicular, or Inguinal Adenopathy.    Assessment:   Annual gynecologic examination 61 y.o. Contraception: post menopausal status  Asymptomatic on HRT therapy.  Vivelle-Dot 0.025 mg patch biweekly; Prometrium 200 mg daily at bedtime bmi-24 Family history osteoporosis  Plan:  Pap: due 2020 Mammogram: Ordered Stool Guaiac Testing:  Ordered Labs: thru pcp Routine preventative health maintenance measures emphasized: Exercise/Diet/Weight control, Tobacco Warnings, Alcohol/Substance use risks and Stress Management  Continue calcium and vitamin D supplementation daily Refill Vivelle-Dot and Prometrium Return to Clinic - 1 Year   [place], [place]   Note: This dictation was prepared with Dragon dictation along with smaller phrase technology. Any transcriptional errors that result from this process are unintentional.

## 2016-04-05 ENCOUNTER — Encounter: Payer: Managed Care, Other (non HMO) | Admitting: Obstetrics and Gynecology

## 2016-04-05 NOTE — Progress Notes (Signed)
Patient ID: Victoria Shelton, female   DOB: [birth year], 61 y.o.   MRN: [id number] ANNUAL PREVENTATIVE CARE GYN  ENCOUNTER NOTE  Subjective:       Victoria Shelton is a 61 y.o. [id number] female here for a routine annual gynecologic exam.  Current complaints: 1.  Menopausal state; asymptomatic on HRT therapy 2.  Family history of osteoporosis 3. 10 lb. Weight gain over 1 yr  Patient presents for routine gynecologic exam. She is concerned about 10 lb. Weight gain since gyn exam last March. She believes this weight gain is due to decreased physical activity and poor diet over the past few months, but also states that her hair and skin have felt drier and that she frequently feels tired. She is menopausal, controlled on HRT, and denies vasomotor symptoms. Since last visit, experienced rectal bleeding, which precipitated a colonoscopy. Patient is now under treatment for ulcerative colitis and has been instructed to avoid meloxicam. 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 Pt denies pelvic pain, vaginal bleeding or abnormal discharge, and new changes in her breasts.   Gynecologic History No LMP recorded. Patient is postmenopausal. Contraception: post menopausal status Last Pap: 02/10/2015 neg/neg.  Last mammogram:4//20/217 birad 0, 05/31/2015 dx left and u/s- simple breast cyst- birad 2 Results were: normal HRT: Vivelle Dot 0.05 mg biweekly; Prometrium 200 mg daily for the first 12 days of every 3rd month.  Obstetric History OB History  Gravida Para Term Preterm AB Living  1 1 1     1   SAB TAB Ectopic Multiple Live Births          1    # Outcome Date GA Lbr Len/2nd Weight Sex Delivery Anes PTL Lv  1 Term 1979   7 lb 1.8 oz (3.225 kg) F Vag-Spont   LIV      Past Medical History:  Diagnosis Date  . Borderline hypertension   . Fibrocystic breast    left   Osteoarthritis of right wrist, knee, ankle   .  Hyperlipemia     Past Surgical History:  Procedure Laterality Date  . ablation  2000   endometrial  . BREAST SURGERY     breast bx- all neg    Current Outpatient Prescriptions on File Prior to Visit  Medication Sig Dispense Refill  . Calcium Carbonate-Vit D-Min (CALCIUM 600+D PLUS MINERALS) 600-400 MG-UNIT CHEW Chew by mouth.    . chlorthalidone (HYGROTON) 25 MG tablet Take by mouth.    . estradiol (VIVELLE-DOT) 0.05 MG/24HR patch Place 1 patch (0.05 mg total) onto the skin 2 (two) times a week. 24 patch 4  . meloxicam (MOBIC) 15 MG tablet Take by mouth.  No longer taking due to blood in stool in fall 2017.   . methylPREDNISolone (MEDROL) 4 MG tablet Take 1 tablet (4 mg total) by mouth daily. Dosepak taper.  Follow directions on packaging 21 tablet 0  . ondansetron (ZOFRAN) 4 MG tablet Take 1 tablet (4 mg total) by mouth daily as needed. 10 tablet 0  . progesterone (PROMETRIUM) 200 MG capsule Take 1 capsule (200 mg total) by mouth daily. Take for the first 12 days of each 3rd  month. 48 capsule 0   No current facility-administered medications on file prior to visit.     No Known Allergies  Social History   Social History  . Marital status: Married    Spouse name: N/A  .  Number of children: N/A  . Years of education: N/A   Occupational History  . Not on file.   Social History Main Topics  . Smoking status: Never Smoker  . Smokeless tobacco: Not on file  . Alcohol use Yes     Comment: rare  . Drug use: No  . Sexual activity: Yes    Birth control/ protection: Post-menopausal   Other Topics Concern  . Not on file   Social History Narrative  . No narrative on file    Family History  Problem Relation Age of Onset  . COPD Mother   . Stroke Mother   . Osteoporosis Mother   . Liver cancer Father   . Cancer Neg Hx   . Diabetes Neg Hx     The following portions of the patient's history were reviewed and updated as appropriate: allergies, current medications, past  family history, past medical history, past social history, past surgical history and problem list.  Review of Systems Review of Systems  Constitutional:       Weight gain   Eyes: Negative.   Respiratory: Negative for cough, sputum production and shortness of breath.   Cardiovascular: Negative for chest pain, orthopnea and leg swelling.  Genitourinary: Negative for dysuria, frequency and urgency.  Musculoskeletal: Positive for joint pain (osteoarthritis).  Neurological: Negative for dizziness and headaches.     Objective:   BP 105/70   Pulse 82   Ht 5\' 4"  (1.626 m)   Wt 151 lb 14.4 oz (68.9 kg)   BMI 26.07 kg/m   CONSTITUTIONAL: Well-developed, well-nourished female in no acute distress.  PSYCHIATRIC: Normal mood and affect. Normal behavior. Normal judgment and thought content. NEUROLGIC: Alert and oriented to person, place, and time. Normal muscle tone coordination. No cranial nerve deficit noted. HENT:  Normocephalic, atraumatic, Oropharynx is clear and moist with symmetrical uvula rise. No oropharyngeal erythema, exudate. EYES: Conjunctivae and EOM are normal. Pupils are equal, round, and reactive to light. No scleral icterus.  NECK: Normal range of motion, supple, no masses.  Normal thyroid.  SKIN: Skin is warm and dry. No rash noted. Not diaphoretic. No erythema. No pallor. CARDIOVASCULAR: Normal heart rate noted, regular rhythm. RESPIRATORY: Clear to auscultation bilaterally. Effort and breath sounds normal, no problems with respiration noted. BREASTS: Symmetric in size. Positive only for normal fibrocystic changes. No masses, skin changes, nipple drainage, or lymphadenopathy. ABDOMEN: Soft, normal bowel sounds, no distention noted.  No tenderness, rebound or guarding.  BLADDER: Normal PELVIC:  External Genitalia: Normal  BUS: Normal  Vagina: Normal, with scant thick white discharge.  Cervix: Normal  Uterus: Normal; Midplane, normal size, shape, mobile, nontender    Adnexa: Normal  RV: External Exam NormaI, No Rectal Masses and Normal Sphincter tone  MUSCULOSKELETAL: Normal range of motion. Right wrist, knee, and ankle tender to palpation. No cyanosis, clubbing, or edema.   LYMPHATIC: No Axillary or Supraclavicular adenopathy.    Assessment:   Annual gynecologic examination 61 y.o. Contraception: post menopausal status  Asymptomatic on HRT therapy.  Vivelle-Dot 0.025 mg patch biweekly; Prometrium 200 mg daily for the first 12 days of every 3rd month.  [id number]; 10 lb. Wt gain over 1 yr. Family history osteoporosis New dx of osteoarthritis managed by PCP New Diagnosis of Ulcerative Colitis   Plan:  Pap: due 2020 Mammogram: Ordered Stool Guaiac Testing:  Not indicated due to Colonoscopy 11/2015- wnl Labs: thru pcp Routine preventative health maintenance measures emphasized: Exercise/Diet/Weight control, Tobacco Warnings, Alcohol/Substance use risks and Stress Management  Continue calcium and vitamin D supplementation daily Refill Vivelle-Dot and Prometrium Return to Clinic - 1 [id number] [place], CMA   Frances Furbish, PA-[facility]  Herold Harms, MD   I have seen, interviewed, and examined the patient in conjunction with the [facility] student and affirm the diagnosis and management plan. Aalyiah Camberos A. Kaylany Tesoriero, MD, FACOG   Note: This dictation was prepared with Dragon dictation along with smaller phrase technology. Any transcriptional errors that result from this process are unintentional.

## 2016-04-12 ENCOUNTER — Encounter: Payer: Self-pay | Admitting: Obstetrics and Gynecology

## 2016-04-12 ENCOUNTER — Ambulatory Visit (INDEPENDENT_AMBULATORY_CARE_PROVIDER_SITE_OTHER): Payer: Commercial Managed Care - PPO | Admitting: Obstetrics and Gynecology

## 2016-04-12 VITALS — BP 105/70 | HR 82 | Ht 64.0 in | Wt 151.9 lb

## 2016-04-12 DIAGNOSIS — Z780 Asymptomatic menopausal state: Secondary | ICD-10-CM | POA: Diagnosis not present

## 2016-04-12 DIAGNOSIS — Z1231 Encounter for screening mammogram for malignant neoplasm of breast: Secondary | ICD-10-CM

## 2016-04-12 DIAGNOSIS — Z1211 Encounter for screening for malignant neoplasm of colon: Secondary | ICD-10-CM

## 2016-04-12 DIAGNOSIS — Z01419 Encounter for gynecological examination (general) (routine) without abnormal findings: Secondary | ICD-10-CM

## 2016-04-12 DIAGNOSIS — Z8262 Family history of osteoporosis: Secondary | ICD-10-CM | POA: Diagnosis not present

## 2016-04-12 DIAGNOSIS — Z1239 Encounter for other screening for malignant neoplasm of breast: Secondary | ICD-10-CM

## 2016-04-12 MED ORDER — PROGESTERONE MICRONIZED 200 MG PO CAPS: 200.0000 mg | ORAL_CAPSULE | Freq: Every day | ORAL | 0 refills | Status: DC

## 2016-04-12 MED ORDER — ESTRADIOL 0.05 MG/24HR TD PTTW: 1.0000 | MEDICATED_PATCH | TRANSDERMAL | 4 refills | Status: DC

## 2016-04-12 NOTE — Patient Instructions (Signed)
1. No Pap smear today. 2. Mammogram is ordered 3. No stool guaiac cards are given because of recent colonoscopy in November 2017 4. Screening labs are to be obtained through primary care 5. Patient is to continue with healthy eating and exercise 6. Patient is to continue with calcium and vitamin D supplementation 7. Patient is to return in 1 year or when necessary 8. Refill HRT 5-Vivelle-Dot and Prometrium   Health Maintenance for Postmenopausal Women Menopause is a normal process in which your reproductive ability comes to an end. This process happens gradually over a span of months to years, usually between the ages of 72 and 58. Menopause is complete when you have missed 12 consecutive menstrual periods. It is important to talk with your health care provider about some of the most common conditions that affect postmenopausal women, such as heart disease, cancer, and bone loss (osteoporosis). Adopting a healthy lifestyle and getting preventive care can help to promote your health and wellness. Those actions can also lower your chances of developing some of these common conditions. What should I know about menopause? During menopause, you may experience a number of symptoms, such as:  Moderate-to-severe hot flashes.  Night sweats.  Decrease in sex drive.  Mood swings.  Headaches.  Tiredness.  Irritability.  Memory problems.  Insomnia. Choosing to treat or not to treat menopausal changes is an individual decision that you make with your health care provider. What should I know about hormone replacement therapy and supplements? Hormone therapy products are effective for treating symptoms that are associated with menopause, such as hot flashes and night sweats. Hormone replacement carries certain risks, especially as you become older. If you are thinking about using estrogen or estrogen with progestin treatments, discuss the benefits and risks with your health care provider. What  should I know about heart disease and stroke? Heart disease, heart attack, and stroke become more likely as you age. This may be due, in part, to the hormonal changes that your body experiences during menopause. These can affect how your body processes dietary fats, triglycerides, and cholesterol. Heart attack and stroke are both medical emergencies. There are many things that you can do to help prevent heart disease and stroke:  Have your blood pressure checked at least every 1-2 years. High blood pressure causes heart disease and increases the risk of stroke.  If you are 37-52 years old, ask your health care provider if you should take aspirin to prevent a heart attack or a stroke.  Do not use any tobacco products, including cigarettes, chewing tobacco, or electronic cigarettes. If you need help quitting, ask your health care provider.  It is important to eat a healthy diet and maintain a healthy weight.  Be sure to include plenty of vegetables, fruits, low-fat dairy products, and lean protein.  Avoid eating foods that are high in solid fats, added sugars, or salt (sodium).  Get regular exercise. This is one of the most important things that you can do for your health.  Try to exercise for at least 150 minutes each week. The type of exercise that you do should increase your heart rate and make you sweat. This is known as moderate-intensity exercise.  Try to do strengthening exercises at least twice each week. Do these in addition to the moderate-intensity exercise.  Know your numbers.Ask your health care provider to check your cholesterol and your blood glucose. Continue to have your blood tested as directed by your health care provider. What should I  know about cancer screening? There are several types of cancer. Take the following steps to reduce your risk and to catch any cancer development as early as possible. Breast Cancer  Practice breast self-awareness.  This means  understanding how your breasts normally appear and feel.  It also means doing regular breast self-exams. Let your health care provider know about any changes, no matter how small.  If you are 40 or older, have a clinician do a breast exam (clinical breast exam or CBE) every year. Depending on your age, family history, and medical history, it may be recommended that you also have a yearly breast X-ray (mammogram).  If you have a family history of breast cancer, talk with your health care provider about genetic screening.  If you are at high risk for breast cancer, talk with your health care provider about having an MRI and a mammogram every year.  Breast cancer (BRCA) gene test is recommended for women who have family members with BRCA-related cancers. Results of the assessment will determine the need for genetic counseling and BRCA1 and for BRCA2 testing. BRCA-related cancers include these types:  Breast. This occurs in males or females.  Ovarian.  Tubal. This may also be called fallopian tube cancer.  Cancer of the abdominal or pelvic lining (peritoneal cancer).  Prostate.  Pancreatic. Cervical, Uterine, and Ovarian Cancer  Your health care provider may recommend that you be screened regularly for cancer of the pelvic organs. These include your ovaries, uterus, and vagina. This screening involves a pelvic exam, which includes checking for microscopic changes to the surface of your cervix (Pap test).  For women ages 21-65, health care providers may recommend a pelvic exam and a Pap test every three years. For women ages 23-65, they may recommend the Pap test and pelvic exam, combined with testing for human papilloma virus (HPV), every five years. Some types of HPV increase your risk of cervical cancer. Testing for HPV may also be done on women of any age who have unclear Pap test results.  Other health care providers may not recommend any screening for nonpregnant women who are considered  low risk for pelvic cancer and have no symptoms. Ask your health care provider if a screening pelvic exam is right for you.  If you have had past treatment for cervical cancer or a condition that could lead to cancer, you need Pap tests and screening for cancer for at least 20 years after your treatment. If Pap tests have been discontinued for you, your risk factors (such as having a new sexual partner) need to be reassessed to determine if you should start having screenings again. Some women have medical problems that increase the chance of getting cervical cancer. In these cases, your health care provider may recommend that you have screening and Pap tests more often.  If you have a family history of uterine cancer or ovarian cancer, talk with your health care provider about genetic screening.  If you have vaginal bleeding after reaching menopause, tell your health care provider.  There are currently no reliable tests available to screen for ovarian cancer. Lung Cancer  Lung cancer screening is recommended for adults 64-61 years old who are at high risk for lung cancer because of a history of smoking. A yearly low-dose CT scan of the lungs is recommended if you:  Currently smoke.  Have a history of at least 30 pack-years of smoking and you currently smoke or have quit within the past 15 years. A  pack-year is smoking an average of one pack of cigarettes per day for one year. Yearly screening should:  Continue until it has been 15 years since you quit.  Stop if you develop a health problem that would prevent you from having lung cancer treatment. Colorectal Cancer  This type of cancer can be detected and can often be prevented.  Routine colorectal cancer screening usually begins at age 69 and continues through age 46.  If you have risk factors for colon cancer, your health care provider may recommend that you be screened at an earlier age.  If you have a family history of colorectal  cancer, talk with your health care provider about genetic screening.  Your health care provider may also recommend using home test kits to check for hidden blood in your stool.  A small camera at the end of a tube can be used to examine your colon directly (sigmoidoscopy or colonoscopy). This is done to check for the earliest forms of colorectal cancer.  Direct examination of the colon should be repeated every 5-10 years until age 21. However, if early forms of precancerous polyps or small growths are found or if you have a family history or genetic risk for colorectal cancer, you may need to be screened more often. Skin Cancer  Check your skin from head to toe regularly.  Monitor any moles. Be sure to tell your health care provider:  About any new moles or changes in moles, especially if there is a change in a mole's shape or color.  If you have a mole that is larger than the size of a pencil eraser.  If any of your family members has a history of skin cancer, especially at a young age, talk with your health care provider about genetic screening.  Always use sunscreen. Apply sunscreen liberally and repeatedly throughout the day.  Whenever you are outside, protect yourself by wearing long sleeves, pants, a wide-brimmed hat, and sunglasses. What should I know about osteoporosis? Osteoporosis is a condition in which bone destruction happens more quickly than new bone creation. After menopause, you may be at an increased risk for osteoporosis. To help prevent osteoporosis or the bone fractures that can happen because of osteoporosis, the following is recommended:  If you are 26-81 years old, get at least 1,000 mg of calcium and at least 600 mg of vitamin D per day.  If you are older than age 20 but younger than age 58, get at least 1,200 mg of calcium and at least 600 mg of vitamin D per day.  If you are older than age 41, get at least 1,200 mg of calcium and at least 800 mg of vitamin D  per day. Smoking and excessive alcohol intake increase the risk of osteoporosis. Eat foods that are rich in calcium and vitamin D, and do weight-bearing exercises several times each week as directed by your health care provider. What should I know about how menopause affects my mental health? Depression may occur at any age, but it is more common as you become older. Common symptoms of depression include:  Low or sad mood.  Changes in sleep patterns.  Changes in appetite or eating patterns.  Feeling an overall lack of motivation or enjoyment of activities that you previously enjoyed.  Frequent crying spells. Talk with your health care provider if you think that you are experiencing depression. What should I know about immunizations? It is important that you get and maintain your immunizations. These include:  Tetanus, diphtheria, and pertussis (Tdap) booster vaccine.  Influenza every year before the flu season begins.  Pneumonia vaccine.  Shingles vaccine. Your health care provider may also recommend other immunizations. This information is not intended to replace advice given to you by your health care provider. Make sure you discuss any questions you have with your health care provider. Document Released: 03/09/2005 Document Revised: 08/05/2015 Document Reviewed: 10/19/2014 Elsevier Interactive Patient Education  2017 [employer].

## 2016-08-06 ENCOUNTER — Encounter (INDEPENDENT_AMBULATORY_CARE_PROVIDER_SITE_OTHER): Payer: Self-pay

## 2016-08-06 ENCOUNTER — Ambulatory Visit
Admission: RE | Admit: 2016-08-06 | Discharge: 2016-08-06 | Disposition: A | Discharge status: Home / Self Care | Payer: Commercial Managed Care - PPO | Source: Ambulatory Visit | Attending: Obstetrics and Gynecology | Admitting: Obstetrics and Gynecology

## 2016-08-06 DIAGNOSIS — Z1239 Encounter for other screening for malignant neoplasm of breast: Secondary | ICD-10-CM

## 2017-04-18 ENCOUNTER — Encounter: Payer: Self-pay | Admitting: Obstetrics and Gynecology

## 2017-04-25 NOTE — Progress Notes (Signed)
Patient ID: Victoria Shelton, female   DOB: 10/16/1955, 62 y.o.   MRN: [id number] ANNUAL PREVENTATIVE CARE GYN  ENCOUNTER NOTE  Subjective:       Victoria Shelton is a 62 y.o. [id number] female here for a routine annual gynecologic exam.  Current complaints:   1.  Menopausal state; asymptomatic on HRT therapy 2.  Family history of osteoporosis 3.  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 Gwen PoundsKowalski   Patient presents for routine gynecologic exam.  Bowel function is normal with MiraLAX use having movement frequency daily.  Patient has history of ulcerative colitis.  Patient is not able to use nonsteroidal anti-inflammatory medications. Bladder function is normal. Exercise activity is reasonable with getting 10,000 steps or more 3 or 4 times a week.  She does have limited use of her right knee because of some meniscus damage; is contemplating possible orthopedic surgery. HRT use continues without side effects.    Gynecologic History No LMP recorded. Patient is postmenopausal. Contraception: post menopausal status Last Pap: 02/10/2015 neg/neg.  Last mammogram:08/07/2016 birad 1 Results were: normal HRT: Vivelle Dot 0.05 mg biweekly; Prometrium 200 mg daily for the first 12 days of every 3rd month.  Obstetric History OB History  Gravida Para Term Preterm AB Living  1 1 1     1   SAB TAB Ectopic Multiple Live Births          1    # Outcome Date GA Lbr Len/2nd Weight Sex Delivery Anes PTL Lv  1 Term 1979   7 lb 1.8 oz (3.225 kg) F Vag-Spont   LIV    Past Medical History:  Diagnosis Date  . Borderline hypertension   . Fibrocystic breast    left   Osteoarthritis of right wrist, knee, ankle   . Hyperlipemia     Past Surgical History:  Procedure Laterality Date  . ablation  2000   endometrial  . BREAST BIOPSY Right    cyst  . BREAST CYST ASPIRATION    . BREAST SURGERY     breast bx- all neg    Current Outpatient Prescriptions on File Prior to Visit  Medication Sig  Dispense Refill  . Calcium Carbonate-Vit D-Min (CALCIUM 600+D PLUS MINERALS) 600-400 MG-UNIT CHEW Chew by mouth.    . chlorthalidone (HYGROTON) 25 MG tablet Take by mouth.    . estradiol (VIVELLE-DOT) 0.05 MG/24HR patch Place 1 patch (0.05 mg total) onto the skin 2 (two) times a week. 24 patch 4  . meloxicam (MOBIC) 15 MG tablet Take by mouth.  No longer taking due to blood in stool in fall 2017.   . methylPREDNISolone (MEDROL) 4 MG tablet Take 1 tablet (4 mg total) by mouth daily. Dosepak taper.  Follow directions on packaging 21 tablet 0  . ondansetron (ZOFRAN) 4 MG tablet Take 1 tablet (4 mg total) by mouth daily as needed. 10 tablet 0  . progesterone (PROMETRIUM) 200 MG capsule Take 1 capsule (200 mg total) by mouth daily. Take for the first 12 days of each 3rd  month. 48 capsule 0   No current facility-administered medications on file prior to visit.     No Known Allergies  Social History   Socioeconomic History  . Marital status: Married    Spouse name: Not on file  . Number of children: Not on file  . Years of education: Not on file  . Highest education level: Not on file  Occupational History  . Not on file  Social Needs  . Financial resource strain: Not on file  . Food insecurity:    Worry: Not on file    Inability: Not on file  . Transportation needs:    Medical: Not on file    Non-medical: Not on file  Tobacco Use  . Smoking status: Never Smoker  . Smokeless tobacco: Never Used  Substance and Sexual Activity  . Alcohol use: Yes    Comment: rare  . Drug use: No  . Sexual activity: Yes    Birth control/protection: Post-menopausal  Lifestyle  . Physical activity:    Days per week: Not on file    Minutes per session: Not on file  . Stress: Not on file  Relationships  . Social connections:    Talks on phone: Not on file    Gets together: Not on file    Attends religious service: Not on file    Active member of club or organization: Not on file    Attends  meetings of clubs or organizations: Not on file    Relationship status: Not on file  . Intimate partner violence:    Fear of current or ex partner: Not on file    Emotionally abused: Not on file    Physically abused: Not on file    Forced sexual activity: Not on file  Other Topics Concern  . Not on file  Social History Narrative  . Not on file    Family History  Problem Relation Age of Onset  . COPD Mother   . Stroke Mother   . Osteoporosis Mother   . Liver cancer Father   . Cancer Neg Hx   . Diabetes Neg Hx     The following portions of the patient's history were reviewed and updated as appropriate: allergies, current medications, past family history, past medical history, past social history, past surgical history and problem list.  Review of Systems Review of Systems  Constitutional: Negative.   HENT: Negative.   Eyes: Negative.   Respiratory: Negative.   Gastrointestinal: Negative.        MiraLAX helps bowel movement frequency to be daily.  No new changes with ulcerative colitis symptoms  Genitourinary: Negative.   Musculoskeletal: Positive for joint pain.       Right knee discomfort due to meniscus injury and possible osteoarthritis  Skin:       Left lower extremity and left breast squamous cell cancers removed by Dr. Gwen Pounds recently  Neurological: Negative.   Endo/Heme/Allergies: Negative.   Psychiatric/Behavioral: Negative.      Objective:   BP 128/82   Pulse 71   Ht 5\' 4"  (1.626 m)   Wt 156 lb 3.2 oz (70.9 kg)   BMI 26.81 kg/m   CONSTITUTIONAL: Well-developed, well-nourished female in no acute distress.  PSYCHIATRIC: Normal mood and affect. Normal behavior. Normal judgment and thought content. NEUROLGIC: Alert and oriented to person, place, and time. Normal muscle tone coordination. No cranial nerve deficit noted. HENT:  Normocephalic, atraumatic EYES: Conjunctivae and EOM are normal.  No scleral icterus.  NECK: Normal range of motion, supple, no  masses.  Normal thyroid.  SKIN: Skin is warm and dry. No rash noted. Not diaphoretic. No erythema. No pallor. CARDIOVASCULAR: Normal heart rate noted, regular rhythm. RESPIRATORY: Clear to auscultation bilaterally. Effort and breath sounds normal, no problems with respiration noted. BREASTS: Symmetric in size. Positive only for normal fibrocystic changes. No masses, skin changes, nipple drainage, or lymphadenopathy.  Left breast skin biopsy site is covered  with bandage ABDOMEN: Soft,  no distention noted.  No tenderness, rebound or guarding.  BLADDER: Normal PELVIC:  External Genitalia: Normal  BUS: Normal  Vagina: Normal estrogen effect  Cervix: Normal; no lesions; no cervical motion tenderness  Uterus: Normal; Midplane, normal size, shape, mobile, nontender   Adnexa: Normal; nonpalpable nontender  RV: External Exam NormaI, No Rectal Masses and Normal Sphincter tone  MUSCULOSKELETAL: Normal range of motion. Right wrist, knee, and ankle tender to palpation. No cyanosis, clubbing, or edema.   LYMPHATIC: No Axillary or Supraclavicular adenopathy.    Assessment:   Annual gynecologic examination 62 y.o. Contraception: post menopausal status  Asymptomatic on HRT therapy.  Vivelle-Dot 0.025 mg patch biweekly; Prometrium 200 mg daily for the first 12 days of every 3rd month.  [id number];  Family history osteoporosis Osteoarthritis managed by PCP  Ulcerative Colitis, stable  Plan:  Pap: due 2020 Mammogram: Ordered Stool Guaiac Testing:  ordered Labs: thru pcp Routine preventative health maintenance measures emphasized: Exercise/Diet/Weight control, Tobacco Warnings, Alcohol/Substance use risks and Stress Management  Continue calcium and vitamin D supplementation daily Refill Vivelle-Dot and Prometrium Avoid nonsteroidal anti-inflammatory medications Return to Clinic - 1 Year   Kitty Cadavid Pitkin, CMA  Herold Harms, MD  Note: This dictation was prepared with Dragon dictation along  with smaller phrase technology. Any transcriptional errors that result from this process are unintentional.

## 2017-04-29 ENCOUNTER — Ambulatory Visit (INDEPENDENT_AMBULATORY_CARE_PROVIDER_SITE_OTHER): Payer: Commercial Managed Care - PPO | Admitting: Obstetrics and Gynecology

## 2017-04-29 ENCOUNTER — Encounter: Payer: Self-pay | Admitting: Obstetrics and Gynecology

## 2017-04-29 VITALS — BP 128/82 | HR 71 | Ht 64.0 in | Wt 156.2 lb

## 2017-04-29 DIAGNOSIS — Z8262 Family history of osteoporosis: Secondary | ICD-10-CM

## 2017-04-29 DIAGNOSIS — Z1239 Encounter for other screening for malignant neoplasm of breast: Secondary | ICD-10-CM

## 2017-04-29 DIAGNOSIS — Z1231 Encounter for screening mammogram for malignant neoplasm of breast: Secondary | ICD-10-CM

## 2017-04-29 DIAGNOSIS — Z01419 Encounter for gynecological examination (general) (routine) without abnormal findings: Secondary | ICD-10-CM

## 2017-04-29 DIAGNOSIS — Z780 Asymptomatic menopausal state: Secondary | ICD-10-CM

## 2017-04-29 DIAGNOSIS — N63 Unspecified lump in unspecified breast: Secondary | ICD-10-CM

## 2017-04-29 DIAGNOSIS — Z1211 Encounter for screening for malignant neoplasm of colon: Secondary | ICD-10-CM

## 2017-04-29 MED ORDER — PROGESTERONE MICRONIZED 200 MG PO CAPS: 200.0000 mg | ORAL_CAPSULE | Freq: Every day | ORAL | 0 refills | Status: DC

## 2017-04-29 MED ORDER — ESTRADIOL 0.05 MG/24HR TD PTTW: 1.0000 | MEDICATED_PATCH | TRANSDERMAL | 4 refills | Status: DC

## 2017-04-29 NOTE — Patient Instructions (Signed)
1.  No Pap smear is done.  Next Pap smear is due in 2020 2.  Mammogram is ordered 3.  Stool guaiac cards are given for colon cancer screening 4.  Screening labs are to be obtained through primary care 5.  Continue with healthy eating and exercise. 6.  Continue with calcium and vitamin D supplementation 7.  Continue with HRT therapy including Vivelle dot 0.05% patch twice a week and Prometrium 200 mg a day days 1 through 12 every third month. 8.  Return in 1 year for annual exam   Health Maintenance for Postmenopausal Women Menopause is a normal process in which your reproductive ability comes to an end. This process happens gradually over a span of months to years, usually between the ages of 41 and 38. Menopause is complete when you have missed 12 consecutive menstrual periods. It is important to talk with your health care provider about some of the most common conditions that affect postmenopausal women, such as heart disease, cancer, and bone loss (osteoporosis). Adopting a healthy lifestyle and getting preventive care can help to promote your health and wellness. Those actions can also lower your chances of developing some of these common conditions. What should I know about menopause? During menopause, you may experience a number of symptoms, such as:  Moderate-to-severe hot flashes.  Night sweats.  Decrease in sex drive.  Mood swings.  Headaches.  Tiredness.  Irritability.  Memory problems.  Insomnia.  Choosing to treat or not to treat menopausal changes is an individual decision that you make with your health care provider. What should I know about hormone replacement therapy and supplements? Hormone therapy products are effective for treating symptoms that are associated with menopause, such as hot flashes and night sweats. Hormone replacement carries certain risks, especially as you become older. If you are thinking about using estrogen or estrogen with progestin  treatments, discuss the benefits and risks with your health care provider. What should I know about heart disease and stroke? Heart disease, heart attack, and stroke become more likely as you age. This may be due, in part, to the hormonal changes that your body experiences during menopause. These can affect how your body processes dietary fats, triglycerides, and cholesterol. Heart attack and stroke are both medical emergencies. There are many things that you can do to help prevent heart disease and stroke:  Have your blood pressure checked at least every 1-2 years. High blood pressure causes heart disease and increases the risk of stroke.  If you are 67-44 years old, ask your health care provider if you should take aspirin to prevent a heart attack or a stroke.  Do not use any tobacco products, including cigarettes, chewing tobacco, or electronic cigarettes. If you need help quitting, ask your health care provider.  It is important to eat a healthy diet and maintain a healthy weight. ? Be sure to include plenty of vegetables, fruits, low-fat dairy products, and lean protein. ? Avoid eating foods that are high in solid fats, added sugars, or salt (sodium).  Get regular exercise. This is one of the most important things that you can do for your health. ? Try to exercise for at least 150 minutes each week. The type of exercise that you do should increase your heart rate and make you sweat. This is known as moderate-intensity exercise. ? Try to do strengthening exercises at least twice each week. Do these in addition to the moderate-intensity exercise.  Know your numbers.Ask your health care  provider to check your cholesterol and your blood glucose. Continue to have your blood tested as directed by your health care provider.  What should I know about cancer screening? There are several types of cancer. Take the following steps to reduce your risk and to catch any cancer development as early as  possible. Breast Cancer  Practice breast self-awareness. ? This means understanding how your breasts normally appear and feel. ? It also means doing regular breast self-exams. Let your health care provider know about any changes, no matter how small.  If you are 5 or older, have a clinician do a breast exam (clinical breast exam or CBE) every year. Depending on your age, family history, and medical history, it may be recommended that you also have a yearly breast X-ray (mammogram).  If you have a family history of breast cancer, talk with your health care provider about genetic screening.  If you are at high risk for breast cancer, talk with your health care provider about having an MRI and a mammogram every year.  Breast cancer (BRCA) gene test is recommended for women who have family members with BRCA-related cancers. Results of the assessment will determine the need for genetic counseling and BRCA1 and for BRCA2 testing. BRCA-related cancers include these types: ? Breast. This occurs in males or females. ? Ovarian. ? Tubal. This may also be called fallopian tube cancer. ? Cancer of the abdominal or pelvic lining (peritoneal cancer). ? Prostate. ? Pancreatic.  Cervical, Uterine, and Ovarian Cancer Your health care provider may recommend that you be screened regularly for cancer of the pelvic organs. These include your ovaries, uterus, and vagina. This screening involves a pelvic exam, which includes checking for microscopic changes to the surface of your cervix (Pap test).  For women ages 21-65, health care providers may recommend a pelvic exam and a Pap test every three years. For women ages 70-65, they may recommend the Pap test and pelvic exam, combined with testing for human papilloma virus (HPV), every five years. Some types of HPV increase your risk of cervical cancer. Testing for HPV may also be done on women of any age who have unclear Pap test results.  Other health care  providers may not recommend any screening for nonpregnant women who are considered low risk for pelvic cancer and have no symptoms. Ask your health care provider if a screening pelvic exam is right for you.  If you have had past treatment for cervical cancer or a condition that could lead to cancer, you need Pap tests and screening for cancer for at least 20 years after your treatment. If Pap tests have been discontinued for you, your risk factors (such as having a new sexual partner) need to be reassessed to determine if you should start having screenings again. Some women have medical problems that increase the chance of getting cervical cancer. In these cases, your health care provider may recommend that you have screening and Pap tests more often.  If you have a family history of uterine cancer or ovarian cancer, talk with your health care provider about genetic screening.  If you have vaginal bleeding after reaching menopause, tell your health care provider.  There are currently no reliable tests available to screen for ovarian cancer.  Lung Cancer Lung cancer screening is recommended for adults 51-72 years old who are at high risk for lung cancer because of a history of smoking. A yearly low-dose CT scan of the lungs is recommended if you:  Currently smoke.  Have a history of at least 30 pack-years of smoking and you currently smoke or have quit within the past 15 years. A pack-year is smoking an average of one pack of cigarettes per day for one year.  Yearly screening should:  Continue until it has been 15 years since you quit.  Stop if you develop a health problem that would prevent you from having lung cancer treatment.  Colorectal Cancer  This type of cancer can be detected and can often be prevented.  Routine colorectal cancer screening usually begins at age 3 and continues through age 13.  If you have risk factors for colon cancer, your health care provider may recommend  that you be screened at an earlier age.  If you have a family history of colorectal cancer, talk with your health care provider about genetic screening.  Your health care provider may also recommend using home test kits to check for hidden blood in your stool.  A small camera at the end of a tube can be used to examine your colon directly (sigmoidoscopy or colonoscopy). This is done to check for the earliest forms of colorectal cancer.  Direct examination of the colon should be repeated every 5-10 years until age 60. However, if early forms of precancerous polyps or small growths are found or if you have a family history or genetic risk for colorectal cancer, you may need to be screened more often.  Skin Cancer  Check your skin from head to toe regularly.  Monitor any moles. Be sure to tell your health care provider: ? About any new moles or changes in moles, especially if there is a change in a mole's shape or color. ? If you have a mole that is larger than the size of a pencil eraser.  If any of your family members has a history of skin cancer, especially at a young age, talk with your health care provider about genetic screening.  Always use sunscreen. Apply sunscreen liberally and repeatedly throughout the day.  Whenever you are outside, protect yourself by wearing long sleeves, pants, a wide-brimmed hat, and sunglasses.  What should I know about osteoporosis? Osteoporosis is a condition in which bone destruction happens more quickly than new bone creation. After menopause, you may be at an increased risk for osteoporosis. To help prevent osteoporosis or the bone fractures that can happen because of osteoporosis, the following is recommended:  If you are 27-10 years old, get at least 1,000 mg of calcium and at least 600 mg of vitamin D per day.  If you are older than age 38 but younger than age 96, get at least 1,200 mg of calcium and at least 600 mg of vitamin D per day.  If you  are older than age 23, get at least 1,200 mg of calcium and at least 800 mg of vitamin D per day.  Smoking and excessive alcohol intake increase the risk of osteoporosis. Eat foods that are rich in calcium and vitamin D, and do weight-bearing exercises several times each week as directed by your health care provider. What should I know about how menopause affects my mental health? Depression may occur at any age, but it is more common as you become older. Common symptoms of depression include:  Low or sad mood.  Changes in sleep patterns.  Changes in appetite or eating patterns.  Feeling an overall lack of motivation or enjoyment of activities that you previously enjoyed.  Frequent crying spells.  Talk with your health care provider if you think that you are experiencing depression. What should I know about immunizations? It is important that you get and maintain your immunizations. These include:  Tetanus, diphtheria, and pertussis (Tdap) booster vaccine.  Influenza every year before the flu season begins.  Pneumonia vaccine.  Shingles vaccine.  Your health care provider may also recommend other immunizations. This information is not intended to replace advice given to you by your health care provider. Make sure you discuss any questions you have with your health care provider. Document Released: 03/09/2005 Document Revised: 08/05/2015 Document Reviewed: 10/19/2014 Elsevier Interactive Patient Education  2018 [employer].

## 2017-05-02 ENCOUNTER — Encounter: Payer: Self-pay | Admitting: Obstetrics and Gynecology

## 2017-05-08 ENCOUNTER — Telehealth: Payer: Self-pay | Admitting: Obstetrics and Gynecology

## 2017-05-08 NOTE — Telephone Encounter (Signed)
The patient called and stated that she would like to speak with Crystal when she gets a moment today. No other information was disclosed. Please advise.

## 2017-05-08 NOTE — Telephone Encounter (Signed)
lmtrc

## 2017-05-08 NOTE — Telephone Encounter (Signed)
Pt is s/p 3 weeks excision of skin ca on left breast. She has noticed a small lump near the incision site. [place] wants her to have a dx mammo instead of routine screening. Pt is unsure if it really is a lump or part of the healing process. At [facility] did not look at incision as it was covered with bandaid. Advised pt to give it 6 weeks to heal. If she still notices a lump to contact office for breast exam. Pt agrees to plan.

## 2017-06-07 ENCOUNTER — Other Ambulatory Visit: Payer: Self-pay | Admitting: Internal Medicine

## 2017-06-07 DIAGNOSIS — M7989 Other specified soft tissue disorders: Secondary | ICD-10-CM

## 2017-06-10 ENCOUNTER — Other Ambulatory Visit: Payer: Self-pay | Admitting: Internal Medicine

## 2017-06-10 DIAGNOSIS — M7989 Other specified soft tissue disorders: Secondary | ICD-10-CM

## 2017-06-13 ENCOUNTER — Ambulatory Visit
Admission: RE | Admit: 2017-06-13 | Discharge: 2017-06-13 | Disposition: A | Discharge status: Home / Self Care | Payer: Commercial Managed Care - PPO | Source: Ambulatory Visit | Attending: Internal Medicine | Admitting: Internal Medicine

## 2017-06-13 DIAGNOSIS — M7989 Other specified soft tissue disorders: Secondary | ICD-10-CM | POA: Insufficient documentation

## 2017-07-11 IMAGING — MG MM SCREENING BREAST TOMO BILATERAL
9 of 12 series · 9 of 28 positions shown · non-contrast
Comparison: Previous exam(s).

CLINICAL DATA: Screening.

EXAM:
2D DIGITAL SCREENING BILATERAL MAMMOGRAM WITH CAD AND ADJUNCT TOMO

[L MLO synth-2D]
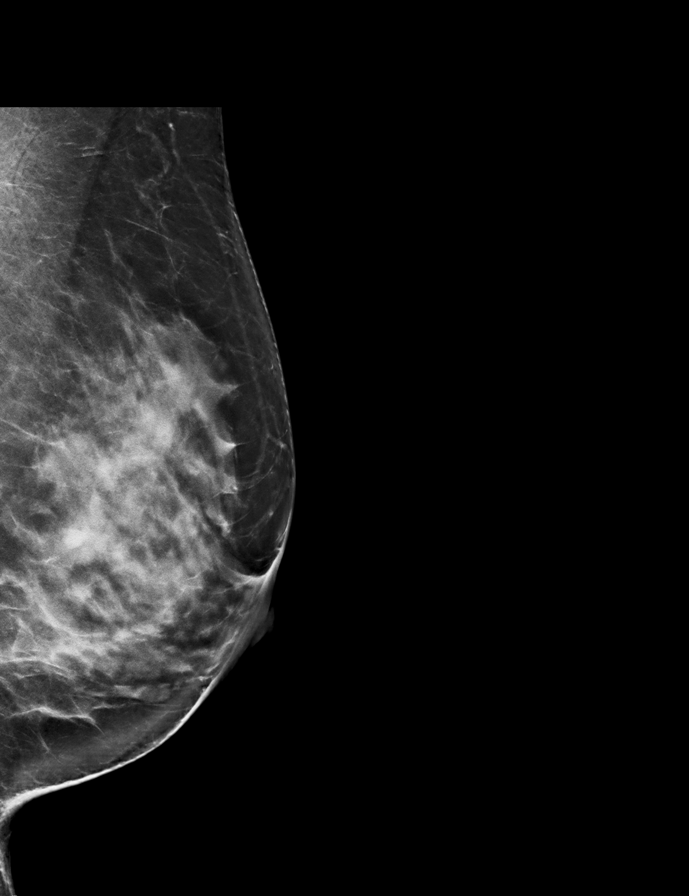

[L CC]
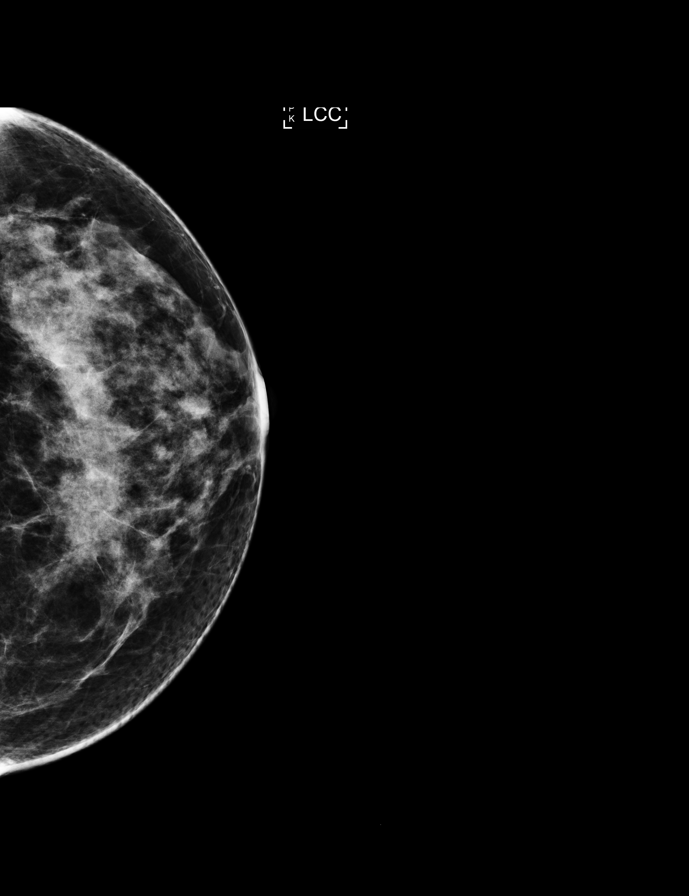

[R MLO]
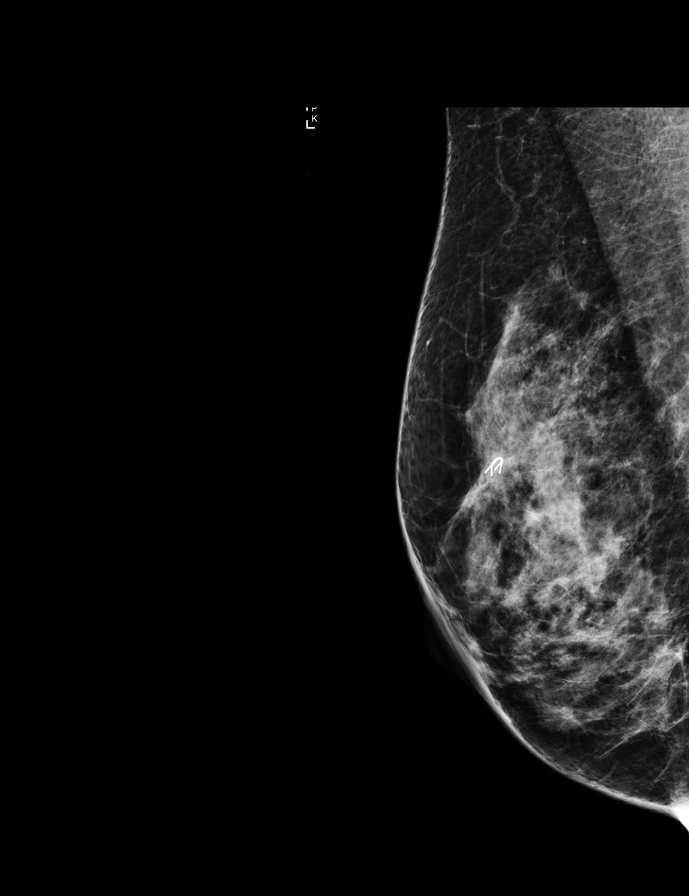

[R CC synth-2D]
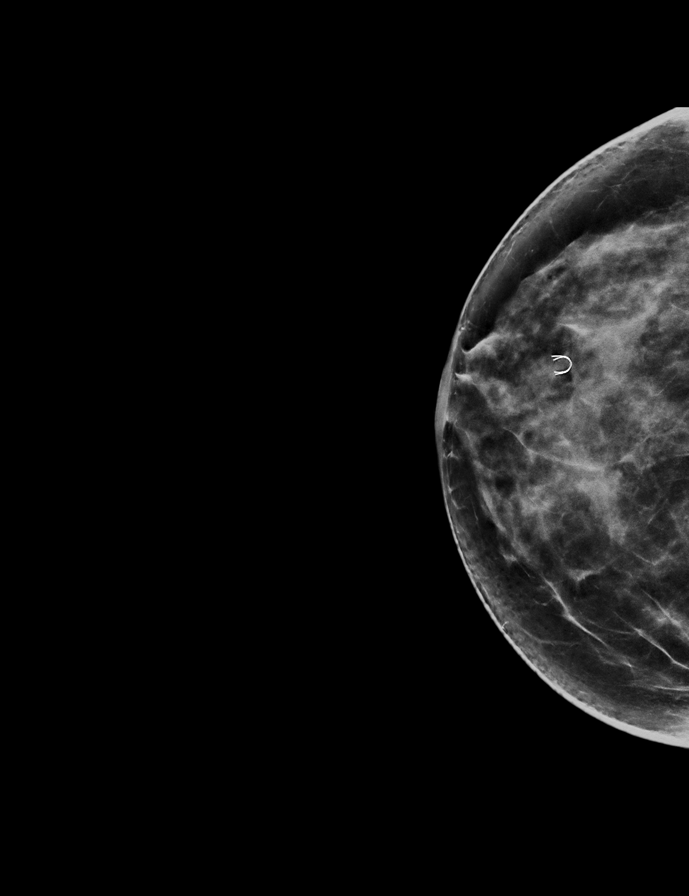

[L CC synth-2D]
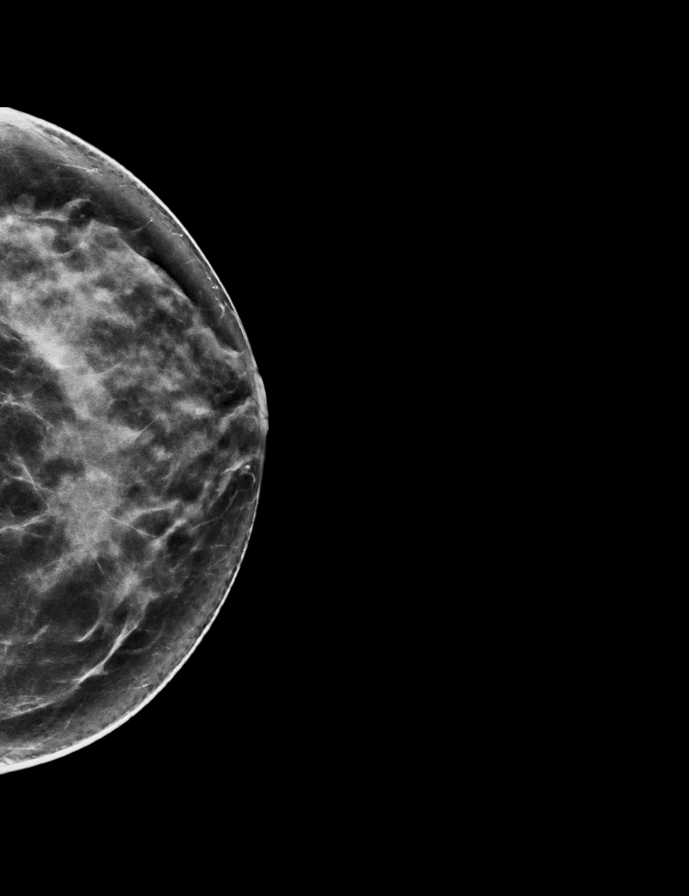

[L MLO]
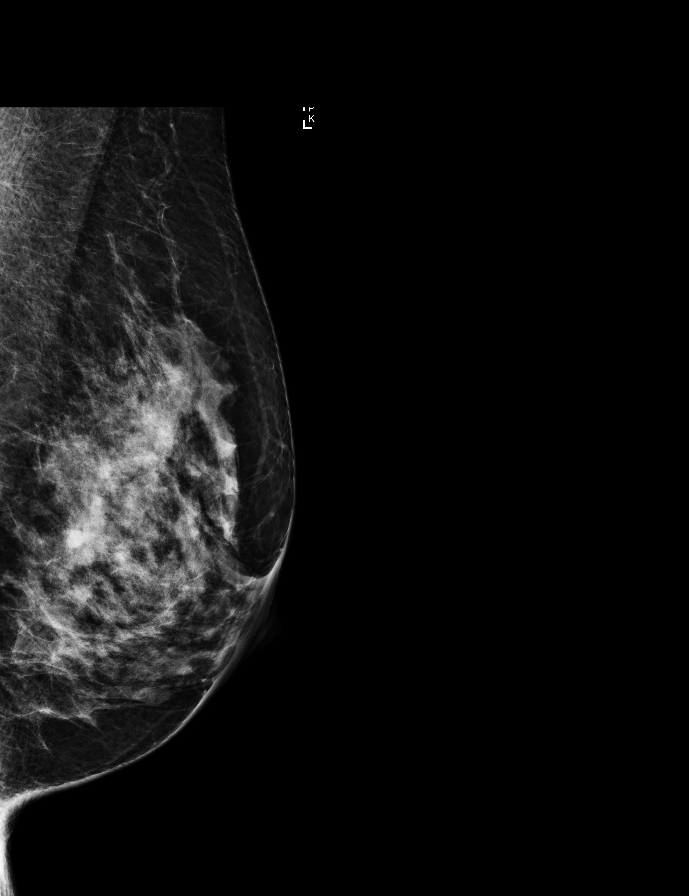

[R MLO synth-2D]
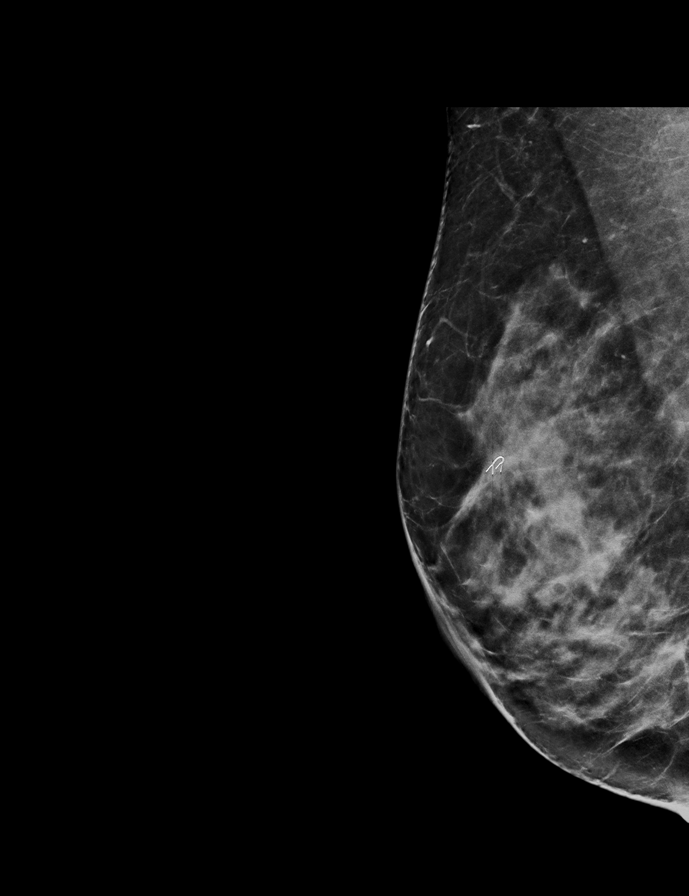

[R CC]
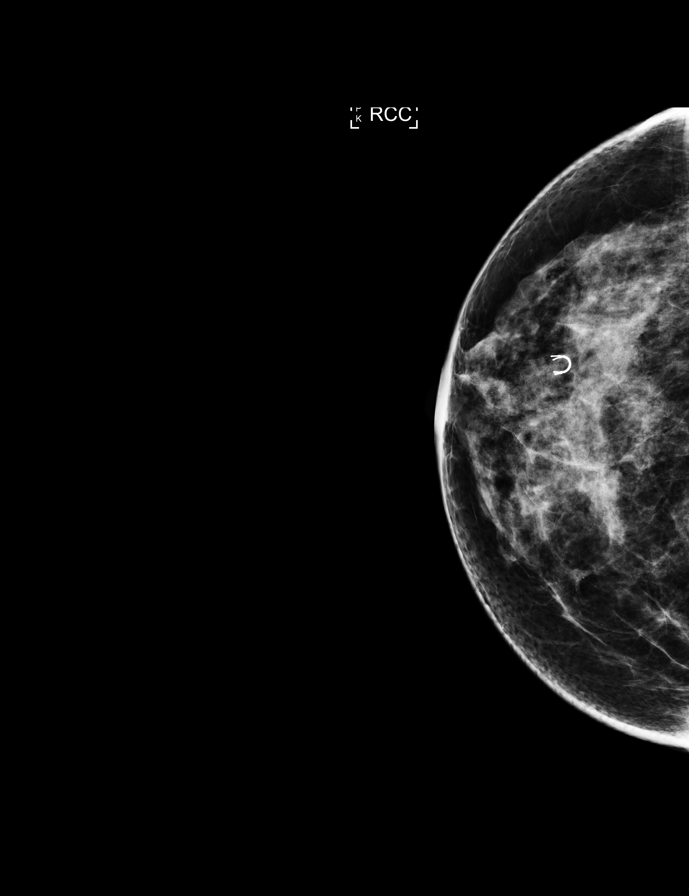

[R CC tomo · tomo slice 35/68.0]
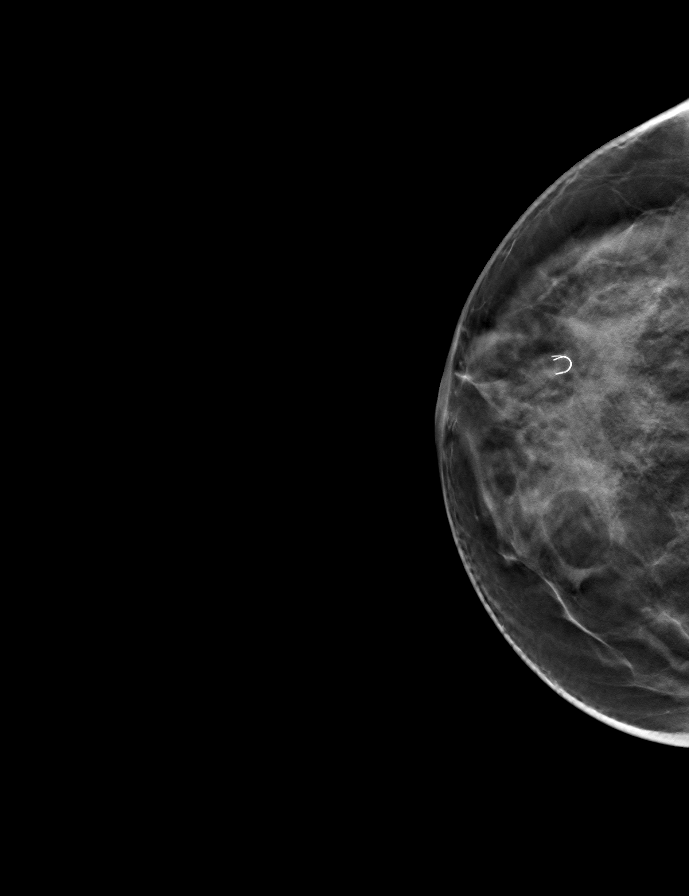

[9 of 28 positions shown; findings below may reference images not displayed]

ACR Breast Density Category c: The breast tissue is heterogeneously
dense, which may obscure small masses.
FINDINGS: In the left breast, a possible asymmetry warrants further
evaluation. There is also a probable lymph node in the lateral
inferior left breast which is more prominent compared to previous
studies. In the right breast, no findings suspicious for malignancy.
Images were processed with CAD.
IMPRESSION: Further evaluation is suggested for possible asymmetry and possible
enlarging lymph node in the left breast.

RECOMMENDATION:
Diagnostic mammogram and possibly ultrasound of the left breast.
(Code:SO-V-66B)

The patient will be contacted regarding the findings, and additional
imaging will be scheduled.

BI-RADS CATEGORY  0: Incomplete. Need additional imaging evaluation
and/or prior mammograms for comparison.

## 2017-08-12 NOTE — Addendum Note (Signed)
Addended by: Marchelle FolksMILLER, Shana Zavaleta G on: 08/12/2017 10:07 AM   Modules accepted: Orders

## 2017-08-13 ENCOUNTER — Other Ambulatory Visit: Payer: Self-pay | Admitting: Obstetrics and Gynecology

## 2017-08-13 DIAGNOSIS — N63 Unspecified lump in unspecified breast: Secondary | ICD-10-CM

## 2017-08-20 ENCOUNTER — Ambulatory Visit
Admission: RE | Admit: 2017-08-20 | Discharge: 2017-08-20 | Disposition: A | Discharge status: Home / Self Care | Payer: Commercial Managed Care - PPO | Source: Ambulatory Visit | Attending: Obstetrics and Gynecology | Admitting: Obstetrics and Gynecology

## 2017-08-20 ENCOUNTER — Other Ambulatory Visit: Payer: Self-pay | Admitting: Obstetrics and Gynecology

## 2017-08-20 ENCOUNTER — Encounter (INDEPENDENT_AMBULATORY_CARE_PROVIDER_SITE_OTHER): Payer: Self-pay

## 2017-08-20 DIAGNOSIS — N63 Unspecified lump in unspecified breast: Secondary | ICD-10-CM

## 2017-08-20 DIAGNOSIS — R921 Mammographic calcification found on diagnostic imaging of breast: Secondary | ICD-10-CM

## 2017-09-16 ENCOUNTER — Encounter: Payer: Self-pay | Admitting: Podiatry

## 2017-09-16 ENCOUNTER — Ambulatory Visit: Payer: Commercial Managed Care - PPO

## 2017-09-19 ENCOUNTER — Encounter

## 2017-09-19 ENCOUNTER — Encounter: Payer: Self-pay | Admitting: Podiatry

## 2017-09-19 ENCOUNTER — Ambulatory Visit: Payer: Commercial Managed Care - PPO | Admitting: Podiatry

## 2017-09-19 ENCOUNTER — Ambulatory Visit (INDEPENDENT_AMBULATORY_CARE_PROVIDER_SITE_OTHER): Payer: Commercial Managed Care - PPO

## 2017-09-19 VITALS — BP 138/82 | HR 52 | Resp 16

## 2017-09-19 DIAGNOSIS — M2012 Hallux valgus (acquired), left foot: Secondary | ICD-10-CM | POA: Diagnosis not present

## 2017-09-19 DIAGNOSIS — M2011 Hallux valgus (acquired), right foot: Secondary | ICD-10-CM

## 2017-09-19 NOTE — Progress Notes (Signed)
Subjective:  Patient ID: Victoria Shelton, female    DOB: Jan 07, 1956,  MRN: [id number] HPI Chief Complaint  Patient presents with  . Foot Pain    1st MPJ bilateral (L>R) - aching x years, gotten worse recently-sharp pains across forefoot left last week  . New Patient (Initial Visit)    62 y.o. female presents with the above complaint.   ROS: Denies fever chills nausea vomiting muscle aches pains calf pain back pain chest pain shortness of breath.  Past Medical History:  Diagnosis Date  . Borderline hypertension   . Fibrocystic breast    left  . Hyperlipemia   . Squamous cell carcinoma   . Ulcerative colitis [name](HCC)    Past Surgical History:  Procedure Laterality Date  . ablation  2000   endometrial  . BREAST BIOPSY Right    cyst  . BREAST CYST ASPIRATION    . BREAST SURGERY     breast bx- all neg    Current Outpatient Medications:  .  chlorthalidone (HYGROTON) 25 MG tablet, Take by mouth., Disp: , Rfl:  .  estradiol (VIVELLE-DOT) 0.05 MG/24HR patch, Place 1 patch (0.05 mg total) onto the skin 2 (two) times a week., Disp: 24 patch, Rfl: 4 .  mesalamine (LIALDA) 1.2 g EC tablet, Take by mouth., Disp: , Rfl:  .  progesterone (PROMETRIUM) 200 MG capsule, Take 1 capsule (200 mg total) by mouth daily. Take for the first 12 days of each 3rd  month., Disp: 48 capsule, Rfl: 0 .  VIMOVO 500-20 MG TBEC, TAKE ONE TABLET BY MOUTH TWICE DAILY WITH FOOD FOR PAIN AND inflammation, Disp: , Rfl: 3  No Known Allergies Review of Systems Objective:   Vitals:   09/19/17 0927  BP: 138/82  Pulse: (!) 52  Resp: 16    General: Well developed, nourished, in no acute distress, alert and oriented x3   Dermatological: Skin is warm, dry and supple bilateral. Nails x 10 are well maintained; remaining integument appears unremarkable at this time. There are no open sores, no preulcerative lesions, no rash or signs of infection present.  Vascular: Dorsalis Pedis artery and Posterior Tibial artery  pedal pulses are 2/4 bilateral with immedate capillary fill time. Pedal hair growth present. No varicosities and no lower extremity edema present bilateral.   Neruologic: Grossly intact via light touch bilateral. Vibratory intact via tuning fork bilateral. Protective threshold with Semmes Wienstein monofilament intact to all pedal sites bilateral. Patellar and Achilles deep tendon reflexes 2+ bilateral. No Babinski or clonus noted bilateral.   Musculoskeletal: No gross boney pedal deformities bilateral. No pain, crepitus, or limitation noted with foot and ankle range of motion bilateral. Muscular strength 5/5 in all groups tested bilateral.  Hallux abductovalgus deformities bilateral she has great range of motion of the first metatarsophalangeal joint and a reducible first metatarsophalangeal joint appears to be tracking but not yet track bound.  She is not hypermobile at the first tarsometatarsal joint.  Gait: Unassisted, Nonantalgic.    Radiographs:  Taken today of mature individual demonstrates hallux abductovalgus deformity with increase in the first intermetatarsal angle greater than normal value and hallux abductus angle greater than normal value with early dislocation of these toes.  Hypertrophic medial condyle of the right first metatarsal phalangeal joint does demonstrate some bony spiculation.  Assessment & Plan:   Assessment: Hallux valgus bilateral.    Plan: Discussed etiology pathology conservative versus surgical therapies.  At this point we discussed in great detail surgical correction of the hole  with an [facility] bunion repair bilateral foot.  She like to have this done after the first of the year I recommend she follow-up with me in late October or November for surgical consult.     Aasiya Creasey T. [place], [place]

## 2017-09-26 ENCOUNTER — Ambulatory Visit: Payer: Self-pay | Admitting: Podiatry

## 2017-10-08 NOTE — Progress Notes (Signed)
This encounter was created in error - please disregard.

## 2018-01-29 HISTORY — PX: COLONOSCOPY: SHX174

## 2018-02-03 ENCOUNTER — Telehealth: Payer: Self-pay

## 2018-02-03 NOTE — Telephone Encounter (Signed)
Pt called no answer LM via voicemail that I called due to her pap smear being overdue and and was calling to assist with making an appointment. Pt was advised to call the office to make an appointment for an annual exam and pap smear.

## 2018-02-24 ENCOUNTER — Ambulatory Visit
Admission: RE | Admit: 2018-02-24 | Discharge: 2018-02-24 | Disposition: A | Discharge status: Home / Self Care | Payer: Commercial Managed Care - PPO | Source: Ambulatory Visit | Attending: Obstetrics and Gynecology | Admitting: Obstetrics and Gynecology

## 2018-02-24 ENCOUNTER — Other Ambulatory Visit: Payer: Self-pay | Admitting: Obstetrics and Gynecology

## 2018-02-24 DIAGNOSIS — R921 Mammographic calcification found on diagnostic imaging of breast: Secondary | ICD-10-CM

## 2018-02-26 ENCOUNTER — Other Ambulatory Visit: Payer: Self-pay | Admitting: Emergency Medicine

## 2018-02-26 ENCOUNTER — Other Ambulatory Visit: Payer: Self-pay | Admitting: Internal Medicine

## 2018-02-26 ENCOUNTER — Other Ambulatory Visit: Payer: Self-pay | Admitting: Obstetrics and Gynecology

## 2018-02-26 DIAGNOSIS — R921 Mammographic calcification found on diagnostic imaging of breast: Secondary | ICD-10-CM

## 2018-03-06 DIAGNOSIS — I10 Essential (primary) hypertension: Secondary | ICD-10-CM | POA: Diagnosis not present

## 2018-03-06 DIAGNOSIS — Z79899 Other long term (current) drug therapy: Secondary | ICD-10-CM | POA: Diagnosis not present

## 2018-03-06 DIAGNOSIS — E7800 Pure hypercholesterolemia, unspecified: Secondary | ICD-10-CM | POA: Diagnosis not present

## 2018-03-31 ENCOUNTER — Other Ambulatory Visit: Payer: Self-pay | Admitting: Obstetrics and Gynecology

## 2018-05-08 ENCOUNTER — Encounter: Payer: Self-pay | Admitting: Obstetrics and Gynecology

## 2018-05-15 ENCOUNTER — Telehealth: Payer: Self-pay | Admitting: Obstetrics and Gynecology

## 2018-05-15 MED ORDER — PROGESTERONE MICRONIZED 200 MG PO CAPS: 200.0000 mg | ORAL_CAPSULE | Freq: Every day | ORAL | 3 refills | Status: DC

## 2018-05-15 NOTE — Telephone Encounter (Signed)
Patient called stating the script for progesterone is not at her pharmacy.

## 2018-05-15 NOTE — Telephone Encounter (Signed)
Hey she has appointment with Melody on 06/12/2018. I have resent it in again under Melody.

## 2018-06-12 ENCOUNTER — Encounter: Payer: Self-pay | Admitting: Obstetrics and Gynecology

## 2018-09-04 ENCOUNTER — Encounter (INDEPENDENT_AMBULATORY_CARE_PROVIDER_SITE_OTHER): Payer: BC Managed Care – PPO | Admitting: Ophthalmology

## 2018-09-09 ENCOUNTER — Other Ambulatory Visit: Payer: Self-pay | Admitting: Internal Medicine

## 2018-09-10 ENCOUNTER — Other Ambulatory Visit: Payer: Self-pay | Admitting: Emergency Medicine

## 2018-09-10 DIAGNOSIS — R921 Mammographic calcification found on diagnostic imaging of breast: Secondary | ICD-10-CM

## 2018-09-11 ENCOUNTER — Ambulatory Visit
Admission: RE | Admit: 2018-09-11 | Discharge: 2018-09-11 | Disposition: A | Discharge status: Home / Self Care | Payer: BC Managed Care – PPO | Source: Ambulatory Visit | Attending: Obstetrics and Gynecology | Admitting: Obstetrics and Gynecology

## 2018-09-11 ENCOUNTER — Other Ambulatory Visit: Payer: Self-pay

## 2018-09-11 ENCOUNTER — Encounter (INDEPENDENT_AMBULATORY_CARE_PROVIDER_SITE_OTHER): Payer: BC Managed Care – PPO | Admitting: Ophthalmology

## 2018-09-11 ENCOUNTER — Other Ambulatory Visit: Payer: Self-pay | Admitting: Internal Medicine

## 2018-09-11 DIAGNOSIS — R921 Mammographic calcification found on diagnostic imaging of breast: Secondary | ICD-10-CM

## 2018-09-11 DIAGNOSIS — I10 Essential (primary) hypertension: Secondary | ICD-10-CM

## 2018-09-11 DIAGNOSIS — H43813 Vitreous degeneration, bilateral: Secondary | ICD-10-CM | POA: Diagnosis not present

## 2018-09-11 DIAGNOSIS — H35033 Hypertensive retinopathy, bilateral: Secondary | ICD-10-CM | POA: Diagnosis not present

## 2018-09-11 DIAGNOSIS — H35342 Macular cyst, hole, or pseudohole, left eye: Secondary | ICD-10-CM

## 2018-09-11 DIAGNOSIS — H2512 Age-related nuclear cataract, left eye: Secondary | ICD-10-CM

## 2018-09-28 ENCOUNTER — Other Ambulatory Visit: Payer: Self-pay | Admitting: Obstetrics and Gynecology

## 2019-01-19 ENCOUNTER — Other Ambulatory Visit: Payer: BC Managed Care – PPO

## 2019-03-12 ENCOUNTER — Encounter (INDEPENDENT_AMBULATORY_CARE_PROVIDER_SITE_OTHER): Payer: BC Managed Care – PPO | Admitting: Ophthalmology

## 2019-03-26 ENCOUNTER — Encounter (INDEPENDENT_AMBULATORY_CARE_PROVIDER_SITE_OTHER): Payer: BC Managed Care – PPO | Admitting: Ophthalmology

## 2019-03-26 ENCOUNTER — Other Ambulatory Visit: Payer: Self-pay

## 2019-03-26 DIAGNOSIS — H35342 Macular cyst, hole, or pseudohole, left eye: Secondary | ICD-10-CM | POA: Diagnosis not present

## 2019-03-26 DIAGNOSIS — H43813 Vitreous degeneration, bilateral: Secondary | ICD-10-CM | POA: Diagnosis not present

## 2019-03-26 DIAGNOSIS — H35033 Hypertensive retinopathy, bilateral: Secondary | ICD-10-CM | POA: Diagnosis not present

## 2019-03-26 DIAGNOSIS — I10 Essential (primary) hypertension: Secondary | ICD-10-CM | POA: Diagnosis not present

## 2019-11-02 ENCOUNTER — Other Ambulatory Visit: Payer: Self-pay | Admitting: Internal Medicine

## 2019-11-02 DIAGNOSIS — R921 Mammographic calcification found on diagnostic imaging of breast: Secondary | ICD-10-CM

## 2019-11-10 ENCOUNTER — Other Ambulatory Visit: Payer: Self-pay | Admitting: Internal Medicine

## 2019-11-10 DIAGNOSIS — R921 Mammographic calcification found on diagnostic imaging of breast: Secondary | ICD-10-CM

## 2019-11-19 ENCOUNTER — Other Ambulatory Visit: Payer: Self-pay | Admitting: Internal Medicine

## 2019-11-19 ENCOUNTER — Ambulatory Visit
Admission: RE | Admit: 2019-11-19 | Discharge: 2019-11-19 | Disposition: A | Discharge status: Home / Self Care | Payer: Medicare Other | Source: Ambulatory Visit | Attending: Internal Medicine | Admitting: Internal Medicine

## 2019-11-19 ENCOUNTER — Other Ambulatory Visit: Payer: Self-pay

## 2019-11-19 DIAGNOSIS — R921 Mammographic calcification found on diagnostic imaging of breast: Secondary | ICD-10-CM

## 2019-11-19 DIAGNOSIS — N6002 Solitary cyst of left breast: Secondary | ICD-10-CM

## 2019-11-23 ENCOUNTER — Ambulatory Visit
Admission: RE | Admit: 2019-11-23 | Discharge: 2019-11-23 | Disposition: A | Discharge status: Home / Self Care | Payer: Medicare Other | Source: Ambulatory Visit | Attending: Internal Medicine | Admitting: Internal Medicine

## 2019-11-23 ENCOUNTER — Other Ambulatory Visit: Payer: Self-pay

## 2019-11-23 DIAGNOSIS — R921 Mammographic calcification found on diagnostic imaging of breast: Secondary | ICD-10-CM

## 2019-11-23 DIAGNOSIS — N6002 Solitary cyst of left breast: Secondary | ICD-10-CM

## 2020-03-24 ENCOUNTER — Encounter (INDEPENDENT_AMBULATORY_CARE_PROVIDER_SITE_OTHER): Payer: BC Managed Care – PPO | Admitting: Ophthalmology

## 2020-03-31 ENCOUNTER — Other Ambulatory Visit: Payer: Self-pay

## 2020-03-31 ENCOUNTER — Encounter (INDEPENDENT_AMBULATORY_CARE_PROVIDER_SITE_OTHER): Payer: BC Managed Care – PPO | Admitting: Ophthalmology

## 2020-03-31 DIAGNOSIS — H43813 Vitreous degeneration, bilateral: Secondary | ICD-10-CM | POA: Diagnosis not present

## 2020-03-31 DIAGNOSIS — H35342 Macular cyst, hole, or pseudohole, left eye: Secondary | ICD-10-CM | POA: Diagnosis not present

## 2020-03-31 DIAGNOSIS — H35033 Hypertensive retinopathy, bilateral: Secondary | ICD-10-CM | POA: Diagnosis not present

## 2020-03-31 DIAGNOSIS — I10 Essential (primary) hypertension: Secondary | ICD-10-CM

## 2021-01-03 ENCOUNTER — Other Ambulatory Visit: Payer: Self-pay | Admitting: Internal Medicine

## 2021-01-03 DIAGNOSIS — Z1231 Encounter for screening mammogram for malignant neoplasm of breast: Secondary | ICD-10-CM

## 2021-02-07 ENCOUNTER — Ambulatory Visit: Payer: BC Managed Care – PPO

## 2021-02-09 ENCOUNTER — Other Ambulatory Visit: Payer: Self-pay

## 2021-02-09 ENCOUNTER — Ambulatory Visit
Admission: RE | Admit: 2021-02-09 | Discharge: 2021-02-09 | Disposition: A | Discharge status: Home / Self Care | Payer: Medicare Other | Source: Ambulatory Visit | Attending: Internal Medicine | Admitting: Internal Medicine

## 2021-02-09 DIAGNOSIS — Z1231 Encounter for screening mammogram for malignant neoplasm of breast: Secondary | ICD-10-CM

## 2021-04-06 ENCOUNTER — Other Ambulatory Visit: Payer: Self-pay

## 2021-04-06 ENCOUNTER — Encounter (INDEPENDENT_AMBULATORY_CARE_PROVIDER_SITE_OTHER): Payer: BC Managed Care – PPO | Admitting: Ophthalmology

## 2021-04-06 DIAGNOSIS — H35342 Macular cyst, hole, or pseudohole, left eye: Secondary | ICD-10-CM

## 2021-04-06 DIAGNOSIS — H43813 Vitreous degeneration, bilateral: Secondary | ICD-10-CM | POA: Diagnosis not present

## 2021-04-06 DIAGNOSIS — I10 Essential (primary) hypertension: Secondary | ICD-10-CM

## 2021-04-06 DIAGNOSIS — H35033 Hypertensive retinopathy, bilateral: Secondary | ICD-10-CM | POA: Diagnosis not present

## 2021-04-13 ENCOUNTER — Encounter (INDEPENDENT_AMBULATORY_CARE_PROVIDER_SITE_OTHER): Payer: BC Managed Care – PPO | Admitting: Ophthalmology

## 2021-09-13 ENCOUNTER — Other Ambulatory Visit: Payer: Self-pay | Admitting: Internal Medicine

## 2021-09-13 DIAGNOSIS — N644 Mastodynia: Secondary | ICD-10-CM

## 2021-10-04 ENCOUNTER — Ambulatory Visit
Admission: RE | Admit: 2021-10-04 | Discharge: 2021-10-04 | Disposition: A | Discharge status: Home / Self Care | Payer: BC Managed Care – PPO | Source: Ambulatory Visit | Attending: Internal Medicine | Admitting: Internal Medicine

## 2021-10-04 DIAGNOSIS — N644 Mastodynia: Secondary | ICD-10-CM | POA: Diagnosis not present

## 2022-03-12 ENCOUNTER — Other Ambulatory Visit: Payer: Self-pay | Admitting: Internal Medicine

## 2022-03-12 DIAGNOSIS — Z1231 Encounter for screening mammogram for malignant neoplasm of breast: Secondary | ICD-10-CM

## 2022-03-29 ENCOUNTER — Ambulatory Visit
Admission: RE | Admit: 2022-03-29 | Discharge: 2022-03-29 | Disposition: A | Discharge status: Home / Self Care | Payer: BC Managed Care – PPO | Source: Ambulatory Visit | Attending: Internal Medicine | Admitting: Internal Medicine

## 2022-03-29 DIAGNOSIS — Z1231 Encounter for screening mammogram for malignant neoplasm of breast: Secondary | ICD-10-CM | POA: Diagnosis present

## 2022-04-03 ENCOUNTER — Other Ambulatory Visit: Payer: Self-pay | Admitting: Internal Medicine

## 2022-04-03 DIAGNOSIS — N63 Unspecified lump in unspecified breast: Secondary | ICD-10-CM

## 2022-04-03 DIAGNOSIS — R928 Other abnormal and inconclusive findings on diagnostic imaging of breast: Secondary | ICD-10-CM

## 2022-04-04 ENCOUNTER — Ambulatory Visit
Admission: RE | Admit: 2022-04-04 | Discharge: 2022-04-04 | Disposition: A | Discharge status: Home / Self Care | Payer: BC Managed Care – PPO | Source: Ambulatory Visit | Attending: Internal Medicine | Admitting: Internal Medicine

## 2022-04-04 DIAGNOSIS — R928 Other abnormal and inconclusive findings on diagnostic imaging of breast: Secondary | ICD-10-CM | POA: Insufficient documentation

## 2022-04-04 DIAGNOSIS — N63 Unspecified lump in unspecified breast: Secondary | ICD-10-CM | POA: Insufficient documentation

## 2022-04-19 ENCOUNTER — Encounter (INDEPENDENT_AMBULATORY_CARE_PROVIDER_SITE_OTHER): Payer: BC Managed Care – PPO | Admitting: Ophthalmology

## 2022-04-19 DIAGNOSIS — H35033 Hypertensive retinopathy, bilateral: Secondary | ICD-10-CM

## 2022-04-19 DIAGNOSIS — I10 Essential (primary) hypertension: Secondary | ICD-10-CM | POA: Diagnosis not present

## 2022-04-19 DIAGNOSIS — H35342 Macular cyst, hole, or pseudohole, left eye: Secondary | ICD-10-CM

## 2022-04-19 DIAGNOSIS — H43813 Vitreous degeneration, bilateral: Secondary | ICD-10-CM | POA: Diagnosis not present

## 2022-07-09 ENCOUNTER — Other Ambulatory Visit: Payer: Self-pay | Admitting: Internal Medicine

## 2022-07-09 DIAGNOSIS — G8929 Other chronic pain: Secondary | ICD-10-CM

## 2022-07-19 ENCOUNTER — Ambulatory Visit
Admission: RE | Admit: 2022-07-19 | Discharge: 2022-07-19 | Disposition: A | Discharge status: Home / Self Care | Payer: BC Managed Care – PPO | Source: Ambulatory Visit | Attending: Internal Medicine | Admitting: Internal Medicine

## 2022-07-19 DIAGNOSIS — G8929 Other chronic pain: Secondary | ICD-10-CM

## 2022-07-19 MED ORDER — IOPAMIDOL (ISOVUE-300) INJECTION 61%
100.0000 mL | Freq: Once | INTRAVENOUS | Status: AC | PRN
  Administered 2022-07-19: 100 mL via INTRAVENOUS

## 2022-10-05 ENCOUNTER — Other Ambulatory Visit: Payer: Self-pay | Admitting: Obstetrics and Gynecology

## 2022-10-15 NOTE — H&P (Signed)
Chief Complaint:  Victoria Shelton is a 67 y.o. female here for Pre Op Consulting . History of Present Illness: Patient presents for a preoperative visit to schedule a D&C, hysteroscopy for persistent PMB.   She feels prepared for her surgery and has learned about it online.   [place] 09/26/22 - Uterus: 8.41 x 3.90 x 4.86 cm - Endometrium 3.4 mm  - Ovaries: wnl  - Other: fibroid uterus, fundal fibroid, 1.3 mm  EMBx 09/26/22 OCCASIONAL STRIPS OF ENDOCERVICAL EPITHELIUM. RARE STRIPS OF ENDOMETRIAL EPITHELIUM. RARE CLUSTERS OF ENDOMETRIAL STROMAL CELLS. NO EVIDENCE OF ENDOMETRITIS. ENDOMETRIAL GLANDULAR FORMATIONS NOT APPRECIATED. NEGATIVE FOR ATYPIA AND MALIGNANCY.   Pertinent Hx: - SVD x 1 - Endometrial ablation in 2000   Past Medical History:  has a past medical history of Acute diarrhea (11/04/2015), Benign hypertension, Fibrocystic breast, Fibroid, History of migraine headaches, Hyperlipidemia, Rotator cuff syndrome, and Ulcerative colitis (CMS/HHS-HCC).  Past Surgical History:  has a past surgical history that includes Incisional breast biopsies; Colonoscopy (08/05/2009); Colonoscopy (12/13/2015); Endometrial ablation (2000); Colonoscopy (11/17/2020); and Breast biopsy. Family History: family history includes COPD in her mother; Cirrhosis in an other family member; Coronary Artery Disease (Blocked arteries around heart) in an other family member; Liver cancer in her father; Liver disease in her father; Lymphoma in an other family member; Myocardial Infarction (Heart attack) in her mother; Osteoporosis (Thinning of bones) in her mother; Pneumonia in her mother; Stroke in her mother and another family member. Social History:  reports that she has quit smoking. She has never used smokeless tobacco. She reports that she does not drink alcohol and does not use drugs. OB/GYN History:  OB History    Gravida 1  Para 1  Term 1  Preterm    AB    Living 1    SAB    IAB     Ectopic    Molar    Multiple    Live Births         Allergies: has No Known Allergies. Medications:  Current Outpatient Medications:    CALCIUM CARB/VIT D3/MINERALS (CALCIUM CARBONATE-VIT D3-MIN) 600 mg (1,500 mg)-400 unit Chew, Take 1 tablet by mouth once daily., Disp: , Rfl:    cetirizine (ZYRTEC) 10 MG tablet, take one tablet by mouth at bedtime, Disp: 90 tablet, Rfl: 3   chlorthalidone 25 MG tablet, TAKE ONE TABLET BY MOUTH ONE TIME DAILY, Disp: 90 tablet, Rfl: 3   cyanocobalamin (VITAMIN B12) 1000 MCG tablet, Take 1 tablet (1,000 mcg total) by mouth once daily, Disp: , Rfl:    estradiol (DOTTI) patch 0.0375 mg/24 hr, APPLY 1 PATCH 2 TIMES A WEEK, Disp: 24 patch, Rfl: 3   meloxicam (MOBIC) 15 MG tablet, Take 1 tablet (15 mg total) by mouth once daily, Disp: 30 tablet, Rfl: 0   mesalamine (LIALDA) 1.2 gram EC tablet, TAKE 2 TABLETS DAILY WITH BREAKFAST, Disp: 180 tablet, Rfl: 3   progesterone (PROMETRIUM) 100 MG capsule, Take 1 capsule (100 mg total) by mouth once daily Taking every night, Disp: 90 capsule, Rfl: 3   rosuvastatin (CRESTOR) 5 MG tablet, take one tablet by mouth one time daily, Disp: 90 tablet, Rfl: 3  Review of Systems: No SOB, no palpitations or chest pain, no new lower extremity edema, no nausea or vomiting or bowel or bladder complaints. See HPI for gyn specific ROS.   Exam:  BP 137/80   Pulse 81   Ht 162.6 cm (5\' 4" )   Wt 70 kg (154 lb 6.4  oz)   LMP  (LMP Unknown)   BMI 26.50 kg/m   Constitutional:  General appearance: Well nourished, well developed female in no acute distress.  Neuro/psych:  Normal mood and affect. No gross motor deficits. CV: RRR Pulm: CTAB Neck:  Supple, normal appearance.  Respiratory:  Normal respiratory effort, no use of accessory muscles Skin:  No visible rashes or external lesions   Impression:  The primary encounter diagnosis was Preop examination. A diagnosis of Post-menopausal bleeding was also pertinent to  this visit.  Plan:  1.  Preoperative visit: D&C hysteroscopy. Consents signed today.  -Risks of surgery were discussed with the patient including but not limited to: bleeding which may require transfusion; infection which may require antibiotics; injury to uterus or surrounding organs; intrauterine scarring which may impair future fertility; need for additional procedures including laparotomy or laparoscopy; and other postoperative/anesthesia complications. Written informed consent was obtained.  This is a scheduled same-day surgery. She will have a postop visit in 2 weeks to review operative findings and pathology.  With hx of endometrial ablation, we will try to get a good sample of the tissues.   Return for Postop check. ~~~~~~~~~~~~~~~~~~~~~~~~~~~~~~~~~~~~~~~~~~~~~~~~~~~~~~~~~~~~ This note is partially written by Jerene Canny, in the presence of and acting as the scribe of Dr. [name], who has reviewed, edited and added to the note to reflect her best personal medical judgment.  This note was generated in part with voice recognition software and I apologize for any typographical errors that were not detected and corrected.    Attestation Statement:  I personally performed the service. (TP)  Victoria Apgar Babette Relic, MD

## 2022-10-22 ENCOUNTER — Other Ambulatory Visit: Payer: Self-pay

## 2022-10-22 ENCOUNTER — Encounter
Admission: RE | Admit: 2022-10-22 | Discharge: 2022-10-22 | Disposition: A | Discharge status: Home / Self Care | Payer: BC Managed Care – PPO | Source: Ambulatory Visit | Attending: Obstetrics and Gynecology | Admitting: Obstetrics and Gynecology

## 2022-10-22 VITALS — Ht 64.0 in | Wt 152.0 lb

## 2022-10-22 DIAGNOSIS — Z01812 Encounter for preprocedural laboratory examination: Secondary | ICD-10-CM

## 2022-10-22 DIAGNOSIS — I10 Essential (primary) hypertension: Secondary | ICD-10-CM

## 2022-10-22 HISTORY — DX: Unspecified rotator cuff tear or rupture of unspecified shoulder, not specified as traumatic: M75.100

## 2022-10-22 HISTORY — DX: Essential (primary) hypertension: I10

## 2022-10-22 HISTORY — DX: Asymptomatic menopausal state: Z78.0

## 2022-10-22 NOTE — Patient Instructions (Addendum)
Your procedure is scheduled on: Monday September 30  Report to the Registration Desk on the 1st floor of the [facility] To find out your arrival time, please call [phone] between 1PM - 3PM on:  Friday September 27  If your arrival time is 6:00 am, do not arrive before that time as the [facility] entrance doors do not open until 6:00 am.  REMEMBER: Instructions that are not followed completely may result in serious medical risk, up to and including death; or upon the discretion of your surgeon and anesthesiologist your surgery may need to be rescheduled.  Do not eat food after midnight the night before surgery.  No gum chewing or hard candies.  You may however, drink CLEAR liquids up to 2 hours before you are scheduled to arrive for your surgery. Do not drink anything within 2 hours of your scheduled arrival time.  Clear liquids include: - water  - apple juice without pulp - gatorade (not RED colors) - black coffee or tea (Do NOT add milk or creamers to the coffee or tea) Do NOT drink anything that is not on this list.  One week prior to surgery: Monday September 23  Stop Anti-inflammatories (NSAIDS) such as Advil, Aleve, Ibuprofen, Motrin, Naproxen, Naprosyn and Aspirin based products such as Excedrin, Goody's Powder, BC Powder.  Stop ANY OVER THE COUNTER supplements until after surgery. B Complex-C  Calcium-Magnesium-Vitamin D   You may however, continue to take Tylenol if needed for pain up until the day of surgery.  Continue taking all prescribed medications with the exception of the following: chlorthalidone (HYGROTON) hold the day of surgery, last dose Sunday September 29   TAKE ONLY THESE MEDICATIONS THE MORNING OF SURGERY WITH A SIP OF WATER:  mesalamine (LIALDA)   No Alcohol for 24 hours before or after surgery.  No Smoking including e-cigarettes for 24 hours before surgery.  No chewable tobacco products for at least 6 hours before surgery.  No nicotine  patches on the day of surgery.  Do not use any "recreational" drugs for at least a week (preferably 2 weeks) before your surgery.  Please be advised that the combination of cocaine and anesthesia may have negative outcomes, up to and including death. If you test positive for cocaine, your surgery will be cancelled.  On the morning of surgery brush your teeth with toothpaste and water, you may rinse your mouth with mouthwash if you wish. Do not swallow any toothpaste or mouthwash.  Do not wear jewelry, make-up, hairpins, clips or nail polish.  For welded (permanent) jewelry: bracelets, anklets, waist bands, etc.  Please have this removed prior to surgery.  If it is not removed, there is a chance that hospital personnel will need to cut it off on the day of surgery.  Do not wear lotions, powders, or perfumes.   Do not shave body hair from the neck down 48 hours before surgery.  Do not bring valuables to the hospital. [facility] is not responsible for any missing/lost belongings or valuables.   Notify your doctor if there is any change in your medical condition (cold, fever, infection).  Wear comfortable clothing (specific to your surgery type) to the hospital.  After surgery, you can help prevent lung complications by doing breathing exercises.  Take deep breaths and cough every 1-2 hours. Your doctor may order a device called an Incentive Spirometer to help you take deep breaths.  If you are being discharged the day of surgery, you will not  be allowed to drive home. You will need a responsible individual to drive you home and stay with you for 24 hours after surgery.   If you are taking public transportation, you will need to have a responsible individual with you.  Please call the Pre-admissions Testing Dept. at [phone] if you have any questions about these instructions.  Surgery Visitation Policy:  Patients having surgery or a procedure may have two visitors.  Children  under the age of 53 must have an adult with them who is not the patient.      How to Use an Incentive Spirometer  An incentive spirometer is a tool that measures how well you are filling your lungs with each breath. Learning to take long, deep breaths using this tool can help you keep your lungs clear and active. This may help to reverse or lessen your chance of developing breathing (pulmonary) problems, especially infection. You may be asked to use a spirometer: After a surgery. If you have a lung problem or a history of smoking. After a long period of time when you have been unable to move or be active. If the spirometer includes an indicator to show the highest number that you have reached, your health care provider or respiratory therapist will help you set a goal. Keep a log of your progress as told by your health care provider. What are the risks? Breathing too quickly may cause dizziness or cause you to pass out. Take your time so you do not get dizzy or light-headed. If you are in pain, you may need to take pain medicine before doing incentive spirometry. It is harder to take a deep breath if you are having pain. How to use your incentive spirometer  Sit up on the edge of your bed or on a chair. Hold the incentive spirometer so that it is in an upright position. Before you use the spirometer, breathe out normally. Place the mouthpiece in your mouth. Make sure your lips are closed tightly around it. Breathe in slowly and as deeply as you can through your mouth, causing the piston or the ball to rise toward the top of the chamber. Hold your breath for 3-5 seconds, or for as long as possible. If the spirometer includes a coach indicator, use this to guide you in breathing. Slow down your breathing if the indicator goes above the marked areas. Remove the mouthpiece from your mouth and breathe out normally. The piston or ball will return to the bottom of the chamber. Rest for a few  seconds, then repeat the steps 10 or more times. Take your time and take a few normal breaths between deep breaths so that you do not get dizzy or light-headed. Do this every 1-2 hours when you are awake. If the spirometer includes a goal marker to show the highest number you have reached (best effort), use this as a goal to work toward during each repetition. After each set of 10 deep breaths, cough a few times. This will help to make sure that your lungs are clear. If you have an incision on your chest or abdomen from surgery, place a pillow or a rolled-up towel firmly against the incision when you cough. This can help to reduce pain while taking deep breaths and coughing. General tips When you are able to get out of bed: Walk around often. Continue to take deep breaths and cough in order to clear your lungs. Keep using the incentive spirometer until your health  care provider says it is okay to stop using it. If you have been in the hospital, you may be told to keep using the spirometer at home. Contact a health care provider if: You are having difficulty using the spirometer. You have trouble using the spirometer as often as instructed. Your pain medicine is not giving enough relief for you to use the spirometer as told. You have a fever. Get help right away if: You develop shortness of breath. You develop a cough with bloody mucus from the lungs. You have fluid or blood coming from an incision site after you cough. Summary An incentive spirometer is a tool that can help you learn to take long, deep breaths to keep your lungs clear and active. You may be asked to use a spirometer after a surgery, if you have a lung problem or a history of smoking, or if you have been inactive for a long period of time. Use your incentive spirometer as instructed every 1-2 hours while you are awake. If you have an incision on your chest or abdomen, place a pillow or a rolled-up towel firmly against your  incision when you cough. This will help to reduce pain. Get help right away if you have shortness of breath, you cough up bloody mucus, or blood comes from your incision when you cough. This information is not intended to replace advice given to you by your health care provider. Make sure you discuss any questions you have with your health care provider. Document Revised: 04/06/2019 Document Reviewed: 04/06/2019 Elsevier Patient Education  2023 [employer].

## 2022-10-25 ENCOUNTER — Encounter
Admission: RE | Admit: 2022-10-25 | Discharge: 2022-10-25 | Disposition: A | Discharge status: Home / Self Care | Payer: BC Managed Care – PPO | Source: Ambulatory Visit | Attending: Obstetrics and Gynecology | Admitting: Obstetrics and Gynecology

## 2022-10-25 DIAGNOSIS — Z01812 Encounter for preprocedural laboratory examination: Secondary | ICD-10-CM | POA: Diagnosis not present

## 2022-10-25 DIAGNOSIS — N950 Postmenopausal bleeding: Secondary | ICD-10-CM | POA: Insufficient documentation

## 2022-10-25 DIAGNOSIS — E876 Hypokalemia: Secondary | ICD-10-CM

## 2022-10-25 DIAGNOSIS — Z79899 Other long term (current) drug therapy: Secondary | ICD-10-CM

## 2022-10-25 DIAGNOSIS — I10 Essential (primary) hypertension: Secondary | ICD-10-CM | POA: Insufficient documentation

## 2022-10-25 DIAGNOSIS — Z01818 Encounter for other preprocedural examination: Secondary | ICD-10-CM | POA: Diagnosis present

## 2022-10-25 LAB — CBC
HCT: 44.2 % (ref 36.0–46.0)
Hemoglobin: 15.1 g/dL — ABNORMAL HIGH (ref 12.0–15.0)
MCH: 30.6 pg (ref 26.0–34.0)
MCHC: 34.2 g/dL (ref 30.0–36.0)
MCV: 89.5 fL (ref 80.0–100.0)
Platelets: 285 10*3/uL (ref 150–400)
RBC: 4.94 MIL/uL (ref 3.87–5.11)
RDW: 12.4 % (ref 11.5–15.5)
WBC: 8.2 10*3/uL (ref 4.0–10.5)
nRBC: 0 % (ref 0.0–0.2)

## 2022-10-25 LAB — BASIC METABOLIC PANEL
Anion gap: 13 (ref 5–15)
BUN: 18 mg/dL (ref 8–23)
CO2: 29 mmol/L (ref 22–32)
Calcium: 9.5 mg/dL (ref 8.9–10.3)
Chloride: 98 mmol/L (ref 98–111)
Creatinine, Ser: 0.84 mg/dL (ref 0.44–1.00)
GFR, Estimated: >60 mL/min (ref >60)
Glucose, Bld: 75 mg/dL (ref 70–99)
Potassium: 3.2 mmol/L — ABNORMAL LOW (ref 3.5–5.1)
Sodium: 140 mmol/L (ref 135–145)

## 2022-10-25 MED ORDER — POTASSIUM CHLORIDE ER 10 MEQ PO TBCR
EXTENDED_RELEASE_TABLET | ORAL | 0 refills | Status: DC
Start: 2022-10-25 — End: 2023-01-11

## 2022-10-25 NOTE — Progress Notes (Signed)
[facility] Perioperative Services: Pre-Admission/Anesthesia Testing  Abnormal Lab Notification and Treatment Plan of Care   Date: 10/25/22  Name: Victoria Shelton MRN:   [id number]  Re: Abnormal labs noted during PAT appointment   Notified:  Provider Name Provider Role Notification Mode  [name], MD OB/GYN (Surgeon) Routed and/or faxed via Sandria Manly, MD Primary Care Provider Routed and/or faxed via [facility]   Clinical Information and Notes:  ABNORMAL LAB VALUE(S): Lab Results  Component Value Date   K 3.2 (L) 10/25/2022   Victoria Shelton is scheduled for an elective DILATATION AND CURETTAGE /HYSTEROSCOPY / POLYPECTOMY on 10/29/2022. In review of her medication reconciliation, it is noted that the patient is taking prescribed diuretic medications (chlorthalidone 25 mg) daily.   Please note, in efforts to promote a safe and effective anesthetic course, per current guidelines/standards set by the [facility] anesthesia team, the minimal acceptable K+ level for the patient to proceed with general anesthesia is 3.0 mmol/L. With that being said, if the patient drops any lower, her elective procedure will need to be postponed until K+ is better optimized. In efforts to prevent case cancellation, will make efforts to optimize pre-surgical K+ level so that patient can safely undergo the planned surgical intervention.   Impression and Plan:  Victoria Shelton found to be HYPOkalemic at 3.2 mmol/L on preoperative labs.   Communicated with patient to discuss results and plans for correction of noted electrolyte derangement.  She is on thiazide-like diuretic therapy, which could account for the noted low K+ level. Patient denies regular use of laxative medications. No recent GI symptoms. Reviewed other potential causes, including decreased intake of dietary K+ and warmer weather resulting in increased insensible losses. Reviewed plans for preoperative optimization as follows:    Meds ordered this encounter  Medications   potassium chloride (KLOR-CON) 10 MEQ tablet    Sig: Take 2 tablets (20 mEq) today, then 1 tablet (10 mEq) daily until surgery. Be sure to take dose on day of surgery. Follow up with PCP for repeat labs.    Dispense:  6 tablet    Refill:  0    Please contact patient once Rx filled. Rx is for preoperative K+ level optimization and needs to be started ASAP.   Encouraged patient to follow up with PCP about 2-3 weeks postoperatively to have labs rechecked to ensure that levels are remaining within normal range. Discussed nutritional intake of K+ rich foods as an adjunctive way to keep her K+ levels normal; list of K+ rich foods  provided. Also mentioned ORS, however advised her not to rely solely on these drinks, as they are high in Na+, and she has a HTN diagnosis.   Will send copy of this note to surgeon and PCP to make them aware of K+ level and plans for correction. Discussed that PCP may elect to pursue a change in diuretic therapy to a K+ sparing type medication, or alternatively, they may consider adding a daily K+ supplement if levels remain low on recheck. Order entered to recheck K+ on the day of her surgery to ensure optimization. Wished patient the best of luck with her upcoming surgery and subsequent recovery. She was encouraged to return call to the PAT clinic, or to her surgeon's office, should any questions or concerns arise between now and the time of her surgery.   Encounter Diagnoses  Name Primary?   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 Despite my best ability to proofread, there is always the potential that unintentional transcriptional errors may  still occur from this process.

## 2022-10-28 MED ORDER — ORAL CARE MOUTH RINSE: 15.0000 mL | Freq: Once | OROMUCOSAL | Status: AC

## 2022-10-28 MED ORDER — LACTATED RINGERS IV SOLN: INTRAVENOUS | Status: DC

## 2022-10-28 MED ORDER — CHLORHEXIDINE GLUCONATE 0.12 % MT SOLN
15.0000 mL | Freq: Once | OROMUCOSAL | Status: AC
  Administered 2022-10-29: 15 mL via OROMUCOSAL

## 2022-10-28 MED ORDER — FAMOTIDINE 20 MG PO TABS
20.0000 mg | ORAL_TABLET | Freq: Once | ORAL | Status: AC
  Administered 2022-10-29: 20 mg via ORAL
  Filled 2022-10-28: qty 1

## 2022-10-28 MED ORDER — POVIDONE-IODINE 10 % EX SWAB: 2.0000 | Freq: Once | CUTANEOUS | Status: DC

## 2022-10-29 ENCOUNTER — Ambulatory Visit: Payer: BC Managed Care – PPO | Admitting: Urgent Care

## 2022-10-29 ENCOUNTER — Other Ambulatory Visit: Payer: Self-pay

## 2022-10-29 ENCOUNTER — Encounter: Payer: Self-pay | Admitting: Obstetrics and Gynecology

## 2022-10-29 ENCOUNTER — Encounter
Admission: RE | Disposition: A | Discharge status: Home / Self Care | Payer: Self-pay | Source: Home / Self Care | Attending: Obstetrics and Gynecology

## 2022-10-29 ENCOUNTER — Ambulatory Visit
Admission: RE | Admit: 2022-10-29 | Discharge: 2022-10-29 | Disposition: A | Discharge status: Home / Self Care | Payer: BC Managed Care – PPO | Attending: Obstetrics and Gynecology | Admitting: Obstetrics and Gynecology

## 2022-10-29 DIAGNOSIS — N950 Postmenopausal bleeding: Secondary | ICD-10-CM | POA: Diagnosis present

## 2022-10-29 DIAGNOSIS — Z79899 Other long term (current) drug therapy: Secondary | ICD-10-CM

## 2022-10-29 DIAGNOSIS — Z87891 Personal history of nicotine dependence: Secondary | ICD-10-CM | POA: Insufficient documentation

## 2022-10-29 DIAGNOSIS — I10 Essential (primary) hypertension: Secondary | ICD-10-CM | POA: Insufficient documentation

## 2022-10-29 DIAGNOSIS — D259 Leiomyoma of uterus, unspecified: Secondary | ICD-10-CM | POA: Insufficient documentation

## 2022-10-29 DIAGNOSIS — Z01812 Encounter for preprocedural laboratory examination: Secondary | ICD-10-CM

## 2022-10-29 DIAGNOSIS — E876 Hypokalemia: Secondary | ICD-10-CM

## 2022-10-29 HISTORY — PX: HYSTEROSCOPY WITH D & C: SHX1775

## 2022-10-29 LAB — POCT I-STAT, CHEM 8
BUN: 15 mg/dL (ref 8–23)
Calcium, Ion: 1.15 mmol/L (ref 1.15–1.40)
Chloride: 101 mmol/L (ref 98–111)
Creatinine, Ser: 0.9 mg/dL (ref 0.44–1.00)
Glucose, Bld: 105 mg/dL — ABNORMAL HIGH (ref 70–99)
HCT: 42 % (ref 36.0–46.0)
Hemoglobin: 14.3 g/dL (ref 12.0–15.0)
Potassium: 3.1 mmol/L — ABNORMAL LOW (ref 3.5–5.1)
Sodium: 139 mmol/L (ref 135–145)
TCO2: 24 mmol/L (ref 22–32)

## 2022-10-29 SURGERY — DILATATION AND CURETTAGE /HYSTEROSCOPY
Anesthesia: General

## 2022-10-29 MED ORDER — MIDAZOLAM HCL 2 MG/2ML IJ SOLN
INTRAMUSCULAR | Status: DC | PRN
  Administered 2022-10-29: 1 mg via INTRAVENOUS

## 2022-10-29 MED ORDER — DEXAMETHASONE SODIUM PHOSPHATE 10 MG/ML IJ SOLN
INTRAMUSCULAR | Status: DC | PRN
  Administered 2022-10-29: 10 mg via INTRAVENOUS

## 2022-10-29 MED ORDER — PROPOFOL 10 MG/ML IV BOLUS
INTRAVENOUS | Status: DC | PRN
  Administered 2022-10-29: 50 mg via INTRAVENOUS
  Administered 2022-10-29: 150 mg via INTRAVENOUS

## 2022-10-29 MED ORDER — CHLORHEXIDINE GLUCONATE 0.12 % MT SOLN
OROMUCOSAL | Status: AC
  Filled 2022-10-29: qty 15

## 2022-10-29 MED ORDER — OXYCODONE HCL 5 MG PO TABS: 5.0000 mg | ORAL_TABLET | Freq: Once | ORAL | Status: DC | PRN

## 2022-10-29 MED ORDER — FAMOTIDINE 20 MG PO TABS
ORAL_TABLET | ORAL | Status: AC
  Filled 2022-10-29: qty 1

## 2022-10-29 MED ORDER — DEXAMETHASONE SODIUM PHOSPHATE 10 MG/ML IJ SOLN
INTRAMUSCULAR | Status: AC
  Filled 2022-10-29: qty 1

## 2022-10-29 MED ORDER — KETOROLAC TROMETHAMINE 30 MG/ML IJ SOLN
INTRAMUSCULAR | Status: DC | PRN
  Administered 2022-10-29: 15 mg via INTRAVENOUS

## 2022-10-29 MED ORDER — DEXMEDETOMIDINE HCL IN NACL 80 MCG/20ML IV SOLN
INTRAVENOUS | Status: DC | PRN
Start: 2022-10-29 — End: 2022-10-29
  Administered 2022-10-29: 12 ug via INTRAVENOUS

## 2022-10-29 MED ORDER — ACETAMINOPHEN 10 MG/ML IV SOLN
INTRAVENOUS | Status: DC | PRN
  Administered 2022-10-29: 1000 mg via INTRAVENOUS

## 2022-10-29 MED ORDER — ONDANSETRON HCL 4 MG/2ML IJ SOLN
INTRAMUSCULAR | Status: AC
  Filled 2022-10-29: qty 2

## 2022-10-29 MED ORDER — LIDOCAINE HCL (CARDIAC) PF 100 MG/5ML IV SOSY
PREFILLED_SYRINGE | INTRAVENOUS | Status: DC | PRN
  Administered 2022-10-29: 60 mg via INTRAVENOUS

## 2022-10-29 MED ORDER — FENTANYL CITRATE (PF) 100 MCG/2ML IJ SOLN
INTRAMUSCULAR | Status: AC
  Filled 2022-10-29: qty 2

## 2022-10-29 MED ORDER — ONDANSETRON HCL 4 MG/2ML IJ SOLN
INTRAMUSCULAR | Status: DC | PRN
  Administered 2022-10-29: 4 mg via INTRAVENOUS

## 2022-10-29 MED ORDER — LIDOCAINE HCL (PF) 2 % IJ SOLN
INTRAMUSCULAR | Status: AC
  Filled 2022-10-29: qty 5

## 2022-10-29 MED ORDER — FENTANYL CITRATE (PF) 100 MCG/2ML IJ SOLN: 25.0000 ug | INTRAMUSCULAR | Status: DC | PRN

## 2022-10-29 MED ORDER — MIDAZOLAM HCL 2 MG/2ML IJ SOLN
INTRAMUSCULAR | Status: AC
  Filled 2022-10-29: qty 2

## 2022-10-29 MED ORDER — KETOROLAC TROMETHAMINE 30 MG/ML IJ SOLN
INTRAMUSCULAR | Status: AC
  Filled 2022-10-29: qty 1

## 2022-10-29 MED ORDER — DEXMEDETOMIDINE HCL IN NACL 80 MCG/20ML IV SOLN
INTRAVENOUS | Status: AC
  Filled 2022-10-29: qty 20

## 2022-10-29 MED ORDER — PROPOFOL 10 MG/ML IV BOLUS
INTRAVENOUS | Status: AC
  Filled 2022-10-29: qty 20

## 2022-10-29 MED ORDER — OXYCODONE HCL 5 MG/5ML PO SOLN: 5.0000 mg | Freq: Once | ORAL | Status: DC | PRN

## 2022-10-29 MED ORDER — FENTANYL CITRATE (PF) 100 MCG/2ML IJ SOLN
INTRAMUSCULAR | Status: DC | PRN
  Administered 2022-10-29: 50 ug via INTRAVENOUS

## 2022-10-29 MED ORDER — ACETAMINOPHEN 10 MG/ML IV SOLN
INTRAVENOUS | Status: AC
  Filled 2022-10-29: qty 100

## 2022-10-29 SURGICAL SUPPLY — 23 items
BAG PRESSURE INF REUSE 1000 (BAG) ×1 IMPLANT
BASIN KIT SINGLE STR (MISCELLANEOUS) ×1 IMPLANT
DEVICE MYOSURE LITE (MISCELLANEOUS) IMPLANT
DEVICE MYOSURE REACH (MISCELLANEOUS) IMPLANT
DRSG TELFA 3X8 NADH STRL (GAUZE/BANDAGES/DRESSINGS) IMPLANT
ELECT REM PT RETURN 9FT ADLT (ELECTROSURGICAL) ×1
ELECTRODE REM PT RTRN 9FT ADLT (ELECTROSURGICAL) ×1 IMPLANT
GLOVE BIO SURGEON STRL SZ7 (GLOVE) ×1 IMPLANT
GLOVE INDICATOR 7.5 STRL GRN (GLOVE) ×1 IMPLANT
GOWN STRL REUS W/ TWL LRG LVL3 (GOWN DISPOSABLE) ×2 IMPLANT
GOWN STRL REUS W/TWL LRG LVL3 (GOWN DISPOSABLE) ×2
IV NS IRRIG 3000ML ARTHROMATIC (IV SOLUTION) ×1 IMPLANT
KIT PROCEDURE FLUENT (KITS) ×1 IMPLANT
KIT TURNOVER CYSTO (KITS) ×1 IMPLANT
MANIFOLD NEPTUNE II (INSTRUMENTS) ×1 IMPLANT
PACK DNC HYST (MISCELLANEOUS) ×1 IMPLANT
PAD PREP OB/GYN DISP 24X41 (PERSONAL CARE ITEMS) ×1 IMPLANT
SCRUB CHG 4% DYNA-HEX 4OZ (MISCELLANEOUS) ×1 IMPLANT
SEAL ROD LENS SCOPE MYOSURE (ABLATOR) IMPLANT
SET CYSTO W/LG BORE CLAMP LF (SET/KITS/TRAYS/PACK) IMPLANT
TRAP FLUID SMOKE EVACUATOR (MISCELLANEOUS) ×1 IMPLANT
TUBING CONNECTING 10 (TUBING) ×1 IMPLANT
WATER STERILE IRR 500ML POUR (IV SOLUTION) ×1 IMPLANT

## 2022-10-29 NOTE — Op Note (Addendum)
Operative Report Hysteroscopy with Dilation and Curettage   Indications: Persistent postmenopausal bleeding  Pre-operative Diagnosis: Postmenopausal bleeding, fibroid uterus  Post-operative Diagnosis: same.  Procedure: 1. Exam under anesthesia 2. Fractional D&C 3. Hysteroscopy 4.  MyoSure endometrial curetting  Surgeon: [name], MD  Assistant(s):  None  Anesthesia: General LMA anesthesia  Anesthesiologist: Stephanie Coup, MD Anesthesiologist: Stephanie Coup, MD CRNA: Jeannene Patella, CRNA  Estimated Blood Loss:  Minimal  Total IV Fluids:  Urine Output: 10ml  Total Fluid Deficit:  800 mL          Specimens: Endocervical curettings, endometrial curettings Myosure         Complications:  None; patient tolerated the procedure well.         Disposition: PACU - hemodynamically stable.         Condition: stable  Findings: Uterus measuring 7 cm by sound; normal cervix, vagina, perineum.  Prior endometrial ablation recognized with endometrial scarring.  Endometrial cavity was not clearly or completely visualized, but there were several pathways through the adhesions.  Neither fallopian tube ostium was visualized.  It is possible there was a fundal perforation, although no abdominal contents beyond the dilation were noted. This area was avoided with all sharp instruments.  Hysterectomy would be reasonable if her bleeding persists.   Indication for procedure/Consents: 67 y.o. F here for scheduled surgery for the aforementioned diagnoses.     Risks of surgery were discussed with the patient including but not limited to: bleeding which may require transfusion; infection which may require antibiotics; injury to uterus or surrounding organs; intrauterine scarring which may impair future fertility; need for additional procedures including laparotomy or laparoscopy; and other postoperative/anesthesia complications. Written informed consent was obtained.    Procedure  Details:    D&C/ Myosure  The patient was taken to the operating room where anesthesia was administered and was found to be adequate.  After a formal and adequate timeout was performed, she was placed in the dorsal lithotomy position and examined with the above findings. She was then prepped and draped in the sterile manner.   Her bladder was catheterized for an estimated amount of clear, yellow urine. A weighed speculum was then placed in the patient's vagina and a single tooth tenaculum was applied to the anterior lip of the cervix.  An endocervical curette was performed. Her cervix was serially dilated to 15 [place] using Hanks dilators, and required hydrodissection with the hysteroscope prior to finding the appropriate pathway.  The hysteroscope was introduced under direct observation.   Using lactated ringers as a distention medium to reveal the above findings.  MyoSure lite was used to carefully resect the visualized tissue under direct visualization.  I think it likely that she had a perforation at the fundus during dilation, as this would intermittently open and although I could not see through it, we did have loss of fluid at that time.  Therefore, careful curettage was undertaken well away from this area.   After further careful visualization of the uterine cavity, the hysteroscope was removed under direct visualization.  All instruments were removed from the vagina.  The patient tolerated the procedure well and was taken to the recovery area awake and in stable condition. She received iv acetaminophen and Toradol prior to leaving the OR.  The patient will be discharged to home as per PACU criteria. Routine postoperative instructions given.  She was prescribed Ibuprofen and Colace.  She will follow up in the clinic in two weeks for postoperative  evaluation.

## 2022-10-29 NOTE — Anesthesia Postprocedure Evaluation (Signed)
Anesthesia Post Note  Patient: Victoria Shelton  Procedure(s) Performed: DILATATION AND CURETTAGE /HYSTEROSCOPY / POLYPECTOMY  Patient location during evaluation: PACU Anesthesia Type: General Level of consciousness: awake and alert Pain management: pain level controlled Vital Signs Assessment: post-procedure vital signs reviewed and stable Respiratory status: spontaneous breathing, nonlabored ventilation, respiratory function stable and patient connected to nasal cannula oxygen Cardiovascular status: blood pressure returned to baseline and stable Postop Assessment: no apparent nausea or vomiting Anesthetic complications: no  No notable events documented.   Last Vitals:  Vitals:   10/29/22 1239 10/29/22 1248  BP: 121/74 (!) 143/82  Pulse: 62 60  Resp: 15 17  Temp: 37.2 C 36.4 C  SpO2: 98% 98%    Last Pain:  Vitals:   10/29/22 1248  TempSrc: Oral  PainSc: 3                  Stephanie Coup

## 2022-10-29 NOTE — Anesthesia Procedure Notes (Signed)
Procedure Name: LMA Insertion Date/Time: 10/29/2022 11:00 AM  Performed by: Jeannene Patella, CRNAPre-anesthesia Checklist: Patient identified, Timeout performed, Emergency Drugs available, Suction available and Patient being monitored Patient Re-evaluated:Patient Re-evaluated prior to induction Oxygen Delivery Method: Circle system utilized Preoxygenation: Pre-oxygenation with 100% oxygen Induction Type: IV induction LMA: LMA inserted Laryngoscope Size: 4 Number of attempts: 1 Placement Confirmation: positive ETCO2 and breath sounds checked- equal and bilateral Tube secured with: Tape Dental Injury: Teeth and Oropharynx as per pre-operative assessment

## 2022-10-29 NOTE — Anesthesia Preprocedure Evaluation (Signed)
Anesthesia Evaluation  Patient identified by MRN, date of birth, ID band Patient awake    Reviewed: Allergy & Precautions, NPO status , Patient's Chart, lab work & pertinent test results  Airway Mallampati: III  TM Distance: >3 FB Neck ROM: full    Dental  (+) Chipped, Dental Advidsory Given   Pulmonary neg pulmonary ROS   Pulmonary exam normal        Cardiovascular hypertension, On Medications negative cardio ROS Normal cardiovascular exam     Neuro/Psych negative neurological ROS  negative psych ROS   GI/Hepatic negative GI ROS, Neg liver ROS,,,  Endo/Other  negative endocrine ROS    Renal/GU negative Renal ROS  negative genitourinary   Musculoskeletal   Abdominal   Peds  Hematology negative hematology ROS (+)   Anesthesia Other Findings Past Medical History: No date: Borderline hypertension No date: Fibrocystic breast disease, left No date: Hyperlipemia No date: Hypertension No date: Menopause No date: Rotator cuff syndrome No date: Squamous cell carcinoma No date: Ulcerative colitis (HCC)  Past Surgical History: 2000: ablation     Comment:  endometrial No date: BREAST BIOPSY; Right     Comment:  cyst No date: BREAST CYST ASPIRATION No date: BREAST SURGERY     Comment:  breast bx- all neg 2020: COLONOSCOPY  BMI    Body Mass Index: 26.09 kg/m      Reproductive/Obstetrics negative OB ROS                             Anesthesia Physical Anesthesia Plan  ASA: 2  Anesthesia Plan: General   Post-op Pain Management:    Induction: Intravenous  PONV Risk Score and Plan: 3 and Ondansetron, Dexamethasone and Midazolam  Airway Management Planned: LMA  Additional Equipment:   Intra-op Plan:   Post-operative Plan: Extubation in OR  Informed Consent: I have reviewed the patients History and Physical, chart, labs and discussed the procedure including the risks,  benefits and alternatives for the proposed anesthesia with the patient or authorized representative who has indicated his/her understanding and acceptance.     Dental Advisory Given  Plan Discussed with: Anesthesiologist, CRNA and Surgeon  Anesthesia Plan Comments: (Patient consented for risks of anesthesia including but not limited to:  - adverse reactions to medications - damage to eyes, teeth, lips or other oral mucosa - nerve damage due to positioning  - sore throat or hoarseness - Damage to heart, brain, nerves, lungs, other parts of body or loss of life  Patient voiced understanding.)       Anesthesia Quick Evaluation

## 2022-10-29 NOTE — Transfer of Care (Signed)
Immediate Anesthesia Transfer of Care Note  Patient: Victoria Shelton  Procedure(s) Performed: DILATATION AND CURETTAGE /HYSTEROSCOPY / POLYPECTOMY  Patient Location: PACU  Anesthesia Type:General  Level of Consciousness: drowsy and patient cooperative  Airway & Oxygen Therapy: Patient Spontanous Breathing and Patient connected to face mask oxygen  Post-op Assessment: Report given to RN and Post -op Vital signs reviewed and stable  Post vital signs: Reviewed and stable  Last Vitals:  Vitals Value Taken Time  BP 118/73 10/29/22 1149  Temp    Pulse 62 10/29/22 1152  Resp 13 10/29/22 1152  SpO2 100 % 10/29/22 1152  Vitals shown include unfiled device data.  Last Pain:  Vitals:   10/29/22 0837  TempSrc: Oral  PainSc: 0-No pain      Patients Stated Pain Goal: 0 (10/29/22 0837)  Complications: No notable events documented.

## 2022-10-29 NOTE — Interval H&P Note (Signed)
History and Physical Interval Note:  10/29/2022 10:45 AM  Victoria Shelton  has presented today for surgery, with the diagnosis of post menopausal bleeding.  The various methods of treatment have been discussed with the patient and family. After consideration of risks, benefits and other options for treatment, the patient has consented to  Procedure(s): DILATATION AND CURETTAGE /HYSTEROSCOPY / POLYPECTOMY (N/A) as a surgical intervention.  The patient's history has been reviewed, patient examined, no change in status, stable for surgery.  I have reviewed the patient's chart and labs.  Questions were answered to the patient's satisfaction.     [name]

## 2022-10-29 NOTE — Discharge Instructions (Addendum)
Discharge instructions after a hysteroscopy with dilation and curettage  Signs and Symptoms to Report  Call our office at [phone] if you have any of the following:    Fever over 100.4 degrees or higher  Severe stomach pain not relieved with pain medications  Bright red bleeding that's heavier than a period that does not slow with rest after the first 24 hours  To go the bathroom a lot (frequency), you can't hold your urine (urgency), or it hurts when you empty your bladder (urinate)  Chest pain  Shortness of breath  Pain in the calves of your legs  Severe nausea and vomiting not relieved with anti-nausea medications  Any concerns  What You Can Expect after Surgery  You may see some pink tinged, bloody fluid. This is normal. You may also have cramping for several days.   Activities after Your Discharge Follow these guidelines to help speed your recovery at home:  Don't drive if you are in pain or taking narcotic pain medicine. You may drive when you can safely slam on the brakes, turn the wheel forcefully, and rotate your torso comfortably. This is typically 4-7 days. Practice in a parking lot or side street prior to attempting to drive regularly.   Ask others to help with household chores for 4 weeks.  Don't do strenuous activities, exercises, or sports like vacuuming, tennis, squash, etc. until your doctor says it is safe to do so.  Walk as you feel able. Rest often since it may take a week or two for your energy level to return to normal.   You may climb stairs  Avoid constipation:   -Eat fruits, vegetables, and whole grains. Eat small meals as your appetite will take time to return to normal.   -Drink 6 to 8 glasses of water each day unless your doctor has told you to limit your fluids.   -Use a laxative or stool softener as needed if constipation becomes a problem. You may take Miralax, metamucil, Citrucil, Colace, Sen[place]ot, FiberCon, etc. If this does not relieve the  constipation, try two tablespoons of Milk Of Magnesia every 8 hours until your bowels move.   You may shower.   Do not get in a hot tub, swimming pool, etc. until your doctor agrees.  Do not douche, use tampons, or have sex until your doctor says it is okay, usually about 2 weeks.  Take your pain medicine when you need it. The medicine may not work as well if the pain is bad.  Take the medicines you were taking before surgery. Other medications you might need are pain medications (ibuprofen), medications for constipation (Colace) and nausea medications (Zofran).      AMBULATORY SURGERY  DISCHARGE INSTRUCTIONS   The drugs that you were given will stay in your system until tomorrow so for the next 24 hours you should not:  Drive an automobile Make any legal decisions Drink any alcoholic beverage   You may resume regular meals tomorrow.  Today it is better to start with liquids and gradually work up to solid foods.  You may eat anything you prefer, but it is better to start with liquids, then soup and crackers, and gradually work up to solid foods.   Please notify your doctor immediately if you have any unusual bleeding, trouble breathing, redness and pain at the surgery site, drainage, fever, or pain not relieved by medication.    Additional Instructions:        Please contact your physician   with any problems or Same Day Surgery at [phone], Monday through Friday 6 am to 4 pm, or [place] [facility] [place] [facility] number at [phone]. 

## 2022-10-30 LAB — SURGICAL PATHOLOGY

## 2022-12-05 ENCOUNTER — Other Ambulatory Visit: Payer: Self-pay | Admitting: Obstetrics and Gynecology

## 2023-01-04 ENCOUNTER — Other Ambulatory Visit: Payer: Self-pay | Admitting: Internal Medicine

## 2023-01-04 DIAGNOSIS — Z1231 Encounter for screening mammogram for malignant neoplasm of breast: Secondary | ICD-10-CM

## 2023-01-11 NOTE — H&P (Signed)
Victoria Shelton is a 67 y.o. [id number] female presenting with Pre Op Consulting (Sign consents)   History of Present Illness: Patient returns today for preop for RA TLH/BSO. She had been experiencing some PMB for which she had a Fractional D&C. Pathology was suggestive of benign endometrial polyp. Visualization during her procedure was limited due to her hx of endometrial ablation. We discussed option for hysterectomy, and she has chosen to continue with surgery for definitive management of her PMB.    Workup: Pap: 05/2018 neg  Fractional D&C 10/29/22  1. Endocervix, curettage,  :      BENIGN ENDOCERVICAL GLANDS WITHOUT SIGNIFICANT DIAGNOSTIC ALTERATION.     NEGATIVE FOR HYPERPLASIA, ATYPIA OR MALIGNANCY.      2. Endometrium, curettage,  :      POLYPOID ENDOMETRIUM WITH FEATURES SUGGESTIVE FOR ENDOMETRIAL POLYP   [place] 09/26/22 - Uterus: 8.41 x 3.90 x 4.86 cm - Endometrium 3.4 mm  - Ovaries: wnl  - Other: fibroid uterus, fundal fibroid, 1.3 mm   EMBx 09/26/22 OCCASIONAL STRIPS OF ENDOCERVICAL EPITHELIUM. RARE STRIPS OF ENDOMETRIAL EPITHELIUM. RARE CLUSTERS OF ENDOMETRIAL STROMAL CELLS. NO EVIDENCE OF ENDOMETRITIS. ENDOMETRIAL GLANDULAR FORMATIONS NOT APPRECIATED. NEGATIVE FOR ATYPIA AND MALIGNANCY.    Pertinent Hx: - SVD x 1 - Endometrial ablation in 2000    Past Medical History:  has a past medical history of Acute diarrhea (11/04/2015), Benign hypertension, Fibrocystic breast, Fibroid, History of migraine headaches, Hyperlipidemia, Rotator cuff syndrome, and Ulcerative colitis (CMS/HHS-HCC).  Past Surgical History:  has a past surgical history that includes Incisional breast biopsies; Colonoscopy (08/05/2009); Colonoscopy (12/13/2015); Endometrial ablation (2000); Colonoscopy (11/17/2020); Breast biopsy; Hysteroscopy; and Dilation and curettage of uterus. Family History: family history includes COPD in her mother; Cirrhosis in an other family member; Coronary Artery Disease (Blocked  arteries around heart) in an other family member; Liver cancer in her father; Liver disease in her father; Lymphoma in an other family member; Myocardial Infarction (Heart attack) in her mother; Osteoporosis (Thinning of bones) in her mother; Pneumonia in her mother; Stroke in her mother and another family member. Social History:  reports that she has quit smoking. She has never used smokeless tobacco. She reports that she does not drink alcohol and does not use drugs. OB/GYN History:  OB History       Gravida 1   Para 1   Term 1   Preterm     AB     Living 1       SAB     IAB     Ectopic     Molar     Multiple     Live Births          Allergies: has No Known Allergies. Medications: Current Medications Current Outpatient Medications:    CALCIUM CARB/VIT D3/MINERALS (CALCIUM CARBONATE-VIT D3-MIN) 600 mg (1,500 mg)-400 unit Chew, Take 1 tablet by mouth once daily., Disp: , Rfl:    cetirizine (ZYRTEC) 10 MG tablet, take one tablet by mouth at bedtime, Disp: 90 tablet, Rfl: 3   chlorthalidone 25 MG tablet, TAKE ONE TABLET BY MOUTH ONE TIME DAILY, Disp: 90 tablet, Rfl: 3   cyanocobalamin (VITAMIN B12) 1000 MCG tablet, Take 1 tablet (1,000 mcg total) by mouth once daily, Disp: , Rfl:    estradiol (DOTTI) patch 0.0375 mg/24 hr, APPLY 1 PATCH 2 TIMES A WEEK, Disp: 24 patch, Rfl: 3   meloxicam (MOBIC) 15 MG tablet, Take 1 tablet (15 mg total) by mouth once daily, Disp: 30 tablet,  Rfl: 0   mesalamine (LIALDA) 1.2 gram EC tablet, TAKE 2 TABLETS DAILY WITH BREAKFAST, Disp: 180 tablet, Rfl: 3   potassium chloride (KLOR-CON M20) 20 MEQ ER tablet, Take 1 tablet (20 mEq total) by mouth once daily, Disp: 30 tablet, Rfl: 11   progesterone (PROMETRIUM) 100 MG capsule, TAKE ONE CAPSULE BY MOUTH ONCE DAILY TAKING EVERY NIGHT, Disp: 90 capsule, Rfl: 3   rosuvastatin (CRESTOR) 5 MG tablet, take one tablet by mouth one time daily, Disp: 90 tablet, Rfl: 3    Review of  Systems: No SOB, no palpitations or chest pain, no new lower extremity edema, no nausea or vomiting or bowel or bladder complaints. See HPI for gyn specific ROS.    Exam:   BP 138/69   Pulse 80   Ht 162.6 cm (5\' 4" )   Wt 70.4 kg (155 lb 3.2 oz)   LMP  (LMP Unknown)   BMI 26.64 kg/m    Constitutional:  General appearance: Well nourished, well developed female in no acute distress.  CV: RRR Pulm: CTAB Neuro/psych:  Normal mood and affect. No gross motor deficits. Neck:  Supple, normal appearance.  Respiratory:  Normal respiratory effort, no use of accessory muscles Skin:  No visible rashes or external lesions    Impression:   The primary encounter diagnosis was Preop examination. A diagnosis of Post-menopausal bleeding was also pertinent to this visit.   Plan:   Postmenopausal bleeding, hx of endometrial ablation -Patient returns for a preoperative discussion regarding her plans to proceed with surgical treatment of her PMB by RA total laparoscopic hysterectomy with bilateral salpingectomy with oophorectomy procedure.  We may perform a cystoscopy to evaluate the urinary tract after the procedure, if surgically indicated for uro tract integrity.    The patient and I discussed the technical aspects of the procedure including the potential for risks and complications. These include but are not limited to the risk of infection requiring post-operative antibiotics or further procedures.  We talked about the risk of injury to adjacent organs including bladder, bowel, ureter, blood vessels or nerves.  We talked about the need to convert to an open incision.  We talked about the possible need for blood transfusion.  We talked about postop complications such as thromboembolic or cardiopulmonary complications.  All of her questions were answered.  Her preoperative exam was completed and the appropriate consents were signed. She is scheduled to undergo this procedure in the near future.      Diagnoses and all orders for this visit:   Preop examination   Post-menopausal bleeding

## 2023-01-18 ENCOUNTER — Encounter
Admission: RE | Admit: 2023-01-18 | Discharge: 2023-01-18 | Disposition: A | Discharge status: Home / Self Care | Payer: BC Managed Care – PPO | Source: Ambulatory Visit | Attending: Obstetrics and Gynecology | Admitting: Obstetrics and Gynecology

## 2023-01-18 VITALS — Ht 64.0 in | Wt 155.2 lb

## 2023-01-18 DIAGNOSIS — I10 Essential (primary) hypertension: Secondary | ICD-10-CM

## 2023-01-18 DIAGNOSIS — Z01810 Encounter for preprocedural cardiovascular examination: Secondary | ICD-10-CM

## 2023-01-18 HISTORY — DX: Postmenopausal bleeding: N95.0

## 2023-01-18 NOTE — Patient Instructions (Signed)
Your procedure is scheduled on:01-25-23 Friday Report to the Registration Desk on the 1st floor of the [facility].Then proceed to the 2nd floor Surgery Desk To find out your arrival time, please call [phone] between 1PM - 3PM on:01-24-23 Thursday If your arrival time is 6:00 am, do not arrive before that time as the [facility] entrance doors do not open until 6:00 am.  REMEMBER: Instructions that are not followed completely may result in serious medical risk, up to and including death; or upon the discretion of your surgeon and anesthesiologist your surgery may need to be rescheduled.  Do not eat food after midnight the night before surgery.  No gum chewing or hard candies.  You may however, drink CLEAR liquids up to 2 hours before you are scheduled to arrive for your surgery. Do not drink anything within 2 hours of your scheduled arrival time.  Clear liquids include: - water  - apple juice without pulp - gatorade (not RED colors) - black coffee or tea (Do NOT add milk or creamers to the coffee or tea) Do NOT drink anything that is not on this list.  One week prior to surgery:Stop NOW (01-18-23) Stop Anti-inflammatories (NSAIDS) such as Advil, Aleve, Ibuprofen, Motrin, Naproxen, Naprosyn and Aspirin based products such as Excedrin, Goody's Powder, BC Powder. Stop ANY OVER THE COUNTER supplements until after surgery (Super B Complex, Calcium-Vitamin D3)  You may however, continue to take Tylenol if needed for pain up until the day of surgery.  Continue taking all of your other prescription medications up until the day of surgery.  ON THE DAY OF SURGERY ONLY TAKE THESE MEDICATIONS WITH SIPS OF WATER: -mesalamine (LIALDA)  -potassium chloride SA (KLOR-CON M)   No Alcohol for 24 hours before or after surgery.  No Smoking including e-cigarettes for 24 hours before surgery.  No chewable tobacco products for at least 6 hours before surgery.  No nicotine patches on the day of  surgery.  Do not use any "recreational" drugs for at least a week (preferably 2 weeks) before your surgery.  Please be advised that the combination of cocaine and anesthesia may have negative outcomes, up to and including death. If you test positive for cocaine, your surgery will be cancelled.  On the morning of surgery brush your teeth with toothpaste and water, you may rinse your mouth with mouthwash if you wish. Do not swallow any toothpaste or mouthwash.  Use CHG Soap as directed on instruction sheet.  Do not wear jewelry, make-up, hairpins, clips or nail polish.  For welded (permanent) jewelry: bracelets, anklets, waist bands, etc.  Please have this removed prior to surgery.  If it is not removed, there is a chance that hospital personnel will need to cut it off on the day of surgery.  Do not wear lotions, powders, or perfumes.   Do not shave body hair from the neck down 48 hours before surgery.  Contact lenses, hearing aids and dentures may not be worn into surgery.  Do not bring valuables to the hospital. [facility] is not responsible for any missing/lost belongings or valuables.    Notify your doctor if there is any change in your medical condition (cold, fever, infection).  Wear comfortable clothing (specific to your surgery type) to the hospital.  After surgery, you can help prevent lung complications by doing breathing exercises.  Take deep breaths and cough every 1-2 hours. Your doctor may order a device called an Incentive Spirometer to help you take deep  breaths. When coughing or sneezing, hold a pillow firmly against your incision with both hands. This is called "splinting." Doing this helps protect your incision. It also decreases belly discomfort.  If you are being admitted to the hospital overnight, leave your suitcase in the car. After surgery it may be brought to your room.  In case of increased patient census, it may be necessary for you, the patient, to  continue your postoperative care in the Same Day Surgery department.  If you are being discharged the day of surgery, you will not be allowed to drive home. You will need a responsible individual to drive you home and stay with you for 24 hours after surgery.   If you are taking public transportation, you will need to have a responsible individual with you.  Please call the Pre-admissions Testing Dept. at [phone] if you have any questions about these instructions.  Surgery Visitation Policy:  Patients having surgery or a procedure may have two visitors.  Children under the age of 75 must have an adult with them who is not the patient.     Preparing for Surgery with CHLORHEXIDINE GLUCONATE (CHG) Soap  Chlorhexidine Gluconate (CHG) Soap  o An antiseptic cleaner that kills germs and bonds with the skin to continue killing germs even after washing  o Used for showering the night before surgery and morning of surgery  Before surgery, you can play an important role by reducing the number of germs on your skin.  CHG (Chlorhexidine gluconate) soap is an antiseptic cleanser which kills germs and bonds with the skin to continue killing germs even after washing.  Please do not use if you have an allergy to CHG or antibacterial soaps. If your skin becomes reddened/irritated stop using the CHG.  1. Shower the NIGHT BEFORE SURGERY and the MORNING OF SURGERY with CHG soap.  2. If you choose to wash your hair, wash your hair first as usual with your normal shampoo.  3. After shampooing, rinse your hair and body thoroughly to remove the shampoo.  4. Use CHG as you would any other liquid soap. You can apply CHG directly to the skin and wash gently with a scrungie or a clean washcloth.  5. Apply the CHG soap to your body only from the neck down. Do not use on open wounds or open sores. Avoid contact with your eyes, ears, mouth, and genitals (private parts). Wash face and genitals (private  parts) with your normal soap.  6. Wash thoroughly, paying special attention to the area where your surgery will be performed.  7. Thoroughly rinse your body with warm water.  8. Do not shower/wash with your normal soap after using and rinsing off the CHG soap.  9. Pat yourself dry with a clean towel.  10. Wear clean pajamas to bed the night before surgery.  12. Place clean sheets on your bed the night of your first shower and do not sleep with pets.  13. Shower again with the CHG soap on the day of surgery prior to arriving at the hospital.  14. Do not apply any deodorants/lotions/powders.  15. Please wear clean clothes to the hospital.  How to Use an Incentive Spirometer An incentive spirometer is a tool that measures how well you are filling your lungs with each breath. Learning to take long, deep breaths using this tool can help you keep your lungs clear and active. This may help to reverse or lessen your chance of developing breathing (pulmonary) problems,  especially infection. You may be asked to use a spirometer: After a surgery. If you have a lung problem or a history of smoking. After a long period of time when you have been unable to move or be active. If the spirometer includes an indicator to show the highest number that you have reached, your health care provider or respiratory therapist will help you set a goal. Keep a log of your progress as told by your health care provider. What are the risks? Breathing too quickly may cause dizziness or cause you to pass out. Take your time so you do not get dizzy or light-headed. If you are in pain, you may need to take pain medicine before doing incentive spirometry. It is harder to take a deep breath if you are having pain. How to use your incentive spirometer  Sit up on the edge of your bed or on a chair. Hold the incentive spirometer so that it is in an upright position. Before you use the spirometer, breathe out  normally. Place the mouthpiece in your mouth. Make sure your lips are closed tightly around it. Breathe in slowly and as deeply as you can through your mouth, causing the piston or the ball to rise toward the top of the chamber. Hold your breath for 3-5 seconds, or for as long as possible. If the spirometer includes a coach indicator, use this to guide you in breathing. Slow down your breathing if the indicator goes above the marked areas. Remove the mouthpiece from your mouth and breathe out normally. The piston or ball will return to the bottom of the chamber. Rest for a few seconds, then repeat the steps 10 or more times. Take your time and take a few normal breaths between deep breaths so that you do not get dizzy or light-headed. Do this every 1-2 hours when you are awake. If the spirometer includes a goal marker to show the highest number you have reached (best effort), use this as a goal to work toward during each repetition. After each set of 10 deep breaths, cough a few times. This will help to make sure that your lungs are clear. If you have an incision on your chest or abdomen from surgery, place a pillow or a rolled-up towel firmly against the incision when you cough. This can help to reduce pain while taking deep breaths and coughing. General tips When you are able to get out of bed: Walk around often. Continue to take deep breaths and cough in order to clear your lungs. Keep using the incentive spirometer until your health care provider says it is okay to stop using it. If you have been in the hospital, you may be told to keep using the spirometer at home. Contact a health care provider if: You are having difficulty using the spirometer. You have trouble using the spirometer as often as instructed. Your pain medicine is not giving enough relief for you to use the spirometer as told. You have a fever. Get help right away if: You develop shortness of breath. You develop a cough  with bloody mucus from the lungs. You have fluid or blood coming from an incision site after you cough. Summary An incentive spirometer is a tool that can help you learn to take long, deep breaths to keep your lungs clear and active. You may be asked to use a spirometer after a surgery, if you have a lung problem or a history of smoking, or if you have been  inactive for a long period of time. Use your incentive spirometer as instructed every 1-2 hours while you are awake. If you have an incision on your chest or abdomen, place a pillow or a rolled-up towel firmly against your incision when you cough. This will help to reduce pain. Get help right away if you have shortness of breath, you cough up bloody mucus, or blood comes from your incision when you cough. This information is not intended to replace advice given to you by your health care provider. Make sure you discuss any questions you have with your health care provider. Document Revised: 04/06/2019 Document Reviewed: 04/06/2019 Elsevier Patient Education  2024 [employer].

## 2023-01-21 ENCOUNTER — Encounter
Admission: RE | Admit: 2023-01-21 | Discharge: 2023-01-21 | Disposition: A | Discharge status: Home / Self Care | Payer: BC Managed Care – PPO | Source: Ambulatory Visit | Attending: Obstetrics and Gynecology | Admitting: Obstetrics and Gynecology

## 2023-01-21 DIAGNOSIS — R9431 Abnormal electrocardiogram [ECG] [EKG]: Secondary | ICD-10-CM | POA: Diagnosis not present

## 2023-01-21 DIAGNOSIS — N950 Postmenopausal bleeding: Secondary | ICD-10-CM

## 2023-01-21 DIAGNOSIS — Z01810 Encounter for preprocedural cardiovascular examination: Secondary | ICD-10-CM | POA: Diagnosis not present

## 2023-01-21 DIAGNOSIS — Z01818 Encounter for other preprocedural examination: Secondary | ICD-10-CM | POA: Diagnosis present

## 2023-01-21 DIAGNOSIS — I10 Essential (primary) hypertension: Secondary | ICD-10-CM

## 2023-01-21 LAB — TYPE AND SCREEN
ABO/RH(D): O NEG
Antibody Screen: NEGATIVE

## 2023-01-25 ENCOUNTER — Other Ambulatory Visit: Payer: Self-pay

## 2023-01-25 ENCOUNTER — Ambulatory Visit: Payer: BC Managed Care – PPO | Admitting: Urgent Care

## 2023-01-25 ENCOUNTER — Encounter
Admission: RE | Disposition: A | Discharge status: Home / Self Care | Payer: Self-pay | Source: Home / Self Care | Attending: Obstetrics and Gynecology

## 2023-01-25 ENCOUNTER — Encounter: Payer: Self-pay | Admitting: Obstetrics and Gynecology

## 2023-01-25 ENCOUNTER — Ambulatory Visit: Payer: BC Managed Care – PPO | Admitting: Anesthesiology

## 2023-01-25 ENCOUNTER — Ambulatory Visit
Admission: RE | Admit: 2023-01-25 | Discharge: 2023-01-25 | Disposition: A | Discharge status: Home / Self Care | Payer: BC Managed Care – PPO | Attending: Obstetrics and Gynecology | Admitting: Obstetrics and Gynecology

## 2023-01-25 DIAGNOSIS — N950 Postmenopausal bleeding: Secondary | ICD-10-CM | POA: Diagnosis present

## 2023-01-25 DIAGNOSIS — N888 Other specified noninflammatory disorders of cervix uteri: Secondary | ICD-10-CM | POA: Diagnosis not present

## 2023-01-25 DIAGNOSIS — I10 Essential (primary) hypertension: Secondary | ICD-10-CM | POA: Diagnosis not present

## 2023-01-25 DIAGNOSIS — Z79899 Other long term (current) drug therapy: Secondary | ICD-10-CM | POA: Diagnosis not present

## 2023-01-25 DIAGNOSIS — D251 Intramural leiomyoma of uterus: Secondary | ICD-10-CM | POA: Insufficient documentation

## 2023-01-25 HISTORY — PX: ROBOTIC ASSISTED TOTAL HYSTERECTOMY WITH BILATERAL SALPINGO OOPHERECTOMY: SHX6086

## 2023-01-25 HISTORY — PX: CYSTOSCOPY: SHX5120

## 2023-01-25 LAB — ABO/RH: ABO/RH(D): O NEG

## 2023-01-25 SURGERY — HYSTERECTOMY, TOTAL, ROBOT-ASSISTED, LAPAROSCOPIC, WITH BILATERAL SALPINGO-OOPHORECTOMY
Anesthesia: General

## 2023-01-25 MED ORDER — ROCURONIUM BROMIDE 10 MG/ML (PF) SYRINGE
PREFILLED_SYRINGE | INTRAVENOUS | Status: AC
  Filled 2023-01-25: qty 10

## 2023-01-25 MED ORDER — ACETAMINOPHEN EXTRA STRENGTH 500 MG PO TABS: 1000.0000 mg | ORAL_TABLET | Freq: Four times a day (QID) | ORAL | 0 refills | Status: AC

## 2023-01-25 MED ORDER — ACETAMINOPHEN 500 MG PO TABS
ORAL_TABLET | ORAL | Status: AC
Start: 2023-01-25 — End: ?
  Filled 2023-01-25: qty 2

## 2023-01-25 MED ORDER — OXYCODONE HCL 5 MG PO TABS: 5.0000 mg | ORAL_TABLET | ORAL | 0 refills | Status: AC | PRN

## 2023-01-25 MED ORDER — SUGAMMADEX SODIUM 200 MG/2ML IV SOLN
INTRAVENOUS | Status: DC | PRN
  Administered 2023-01-25: 200 mg via INTRAVENOUS

## 2023-01-25 MED ORDER — CHLORHEXIDINE GLUCONATE 0.12 % MT SOLN
OROMUCOSAL | Status: AC
  Filled 2023-01-25: qty 15

## 2023-01-25 MED ORDER — MIDAZOLAM HCL 2 MG/2ML IJ SOLN
INTRAMUSCULAR | Status: DC | PRN
  Administered 2023-01-25: 2 mg via INTRAVENOUS

## 2023-01-25 MED ORDER — LACTATED RINGERS IV SOLN: INTRAVENOUS | Status: DC

## 2023-01-25 MED ORDER — ACETAMINOPHEN 500 MG PO TABS
1000.0000 mg | ORAL_TABLET | ORAL | Status: AC
  Administered 2023-01-25: 1000 mg via ORAL

## 2023-01-25 MED ORDER — ACETAMINOPHEN 10 MG/ML IV SOLN
INTRAVENOUS | Status: AC
  Filled 2023-01-25: qty 100

## 2023-01-25 MED ORDER — ACETAMINOPHEN 10 MG/ML IV SOLN
INTRAVENOUS | Status: DC | PRN
  Administered 2023-01-25: 1000 mg via INTRAVENOUS

## 2023-01-25 MED ORDER — CEFAZOLIN SODIUM-DEXTROSE 2-4 GM/100ML-% IV SOLN
INTRAVENOUS | Status: AC
  Filled 2023-01-25: qty 100

## 2023-01-25 MED ORDER — CHLORHEXIDINE GLUCONATE 0.12 % MT SOLN
15.0000 mL | Freq: Once | OROMUCOSAL | Status: AC
  Administered 2023-01-25: 15 mL via OROMUCOSAL

## 2023-01-25 MED ORDER — FENTANYL CITRATE (PF) 100 MCG/2ML IJ SOLN
INTRAMUSCULAR | Status: AC
  Filled 2023-01-25: qty 2

## 2023-01-25 MED ORDER — EPHEDRINE SULFATE-NACL 50-0.9 MG/10ML-% IV SOSY
PREFILLED_SYRINGE | INTRAVENOUS | Status: DC | PRN
  Administered 2023-01-25: 10 mg via INTRAVENOUS

## 2023-01-25 MED ORDER — DEXAMETHASONE SODIUM PHOSPHATE 10 MG/ML IJ SOLN
INTRAMUSCULAR | Status: AC
  Filled 2023-01-25: qty 1

## 2023-01-25 MED ORDER — OXYCODONE HCL 5 MG PO TABS
ORAL_TABLET | ORAL | Status: AC
  Filled 2023-01-25: qty 1

## 2023-01-25 MED ORDER — EPHEDRINE 5 MG/ML INJ
INTRAVENOUS | Status: AC
  Filled 2023-01-25: qty 5

## 2023-01-25 MED ORDER — KETOROLAC TROMETHAMINE 30 MG/ML IJ SOLN
INTRAMUSCULAR | Status: AC
  Filled 2023-01-25: qty 1

## 2023-01-25 MED ORDER — LIDOCAINE HCL (PF) 2 % IJ SOLN
INTRAMUSCULAR | Status: AC
  Filled 2023-01-25: qty 5

## 2023-01-25 MED ORDER — ONDANSETRON HCL 4 MG/2ML IJ SOLN
INTRAMUSCULAR | Status: DC | PRN
  Administered 2023-01-25: 4 mg via INTRAVENOUS

## 2023-01-25 MED ORDER — PROPOFOL 1000 MG/100ML IV EMUL
INTRAVENOUS | Status: AC
  Filled 2023-01-25: qty 100

## 2023-01-25 MED ORDER — ORAL CARE MOUTH RINSE: 15.0000 mL | Freq: Once | OROMUCOSAL | Status: AC

## 2023-01-25 MED ORDER — OXYCODONE HCL 5 MG/5ML PO SOLN: 5.0000 mg | Freq: Once | ORAL | Status: AC | PRN

## 2023-01-25 MED ORDER — DEXMEDETOMIDINE HCL IN NACL 80 MCG/20ML IV SOLN
INTRAVENOUS | Status: AC
  Filled 2023-01-25: qty 20

## 2023-01-25 MED ORDER — ROCURONIUM BROMIDE 100 MG/10ML IV SOLN
INTRAVENOUS | Status: DC | PRN
  Administered 2023-01-25: 50 mg via INTRAVENOUS
  Administered 2023-01-25: 20 mg via INTRAVENOUS
  Administered 2023-01-25: 50 mg via INTRAVENOUS

## 2023-01-25 MED ORDER — HEMOSTATIC AGENTS (NO CHARGE) OPTIME
TOPICAL | Status: DC | PRN
  Administered 2023-01-25: 1 via TOPICAL

## 2023-01-25 MED ORDER — LACTATED RINGERS IV SOLN: INTRAVENOUS | Status: DC | PRN

## 2023-01-25 MED ORDER — KETOROLAC TROMETHAMINE 30 MG/ML IJ SOLN
INTRAMUSCULAR | Status: DC | PRN
  Administered 2023-01-25: 30 mg via INTRAVENOUS

## 2023-01-25 MED ORDER — BUPIVACAINE HCL (PF) 0.5 % IJ SOLN
INTRAMUSCULAR | Status: DC | PRN
  Administered 2023-01-25: 13 mL

## 2023-01-25 MED ORDER — CEFAZOLIN SODIUM-DEXTROSE 2-4 GM/100ML-% IV SOLN
2.0000 g | INTRAVENOUS | Status: AC
  Administered 2023-01-25: 2 g via INTRAVENOUS

## 2023-01-25 MED ORDER — ONDANSETRON HCL 4 MG/2ML IJ SOLN: 4.0000 mg | Freq: Once | INTRAMUSCULAR | Status: DC | PRN

## 2023-01-25 MED ORDER — ONDANSETRON 4 MG PO TBDP: 4.0000 mg | ORAL_TABLET | Freq: Three times a day (TID) | ORAL | 0 refills | Status: AC | PRN

## 2023-01-25 MED ORDER — FENTANYL CITRATE (PF) 100 MCG/2ML IJ SOLN: 25.0000 ug | INTRAMUSCULAR | Status: DC | PRN

## 2023-01-25 MED ORDER — DOCUSATE SODIUM 100 MG PO CAPS: 100.0000 mg | ORAL_CAPSULE | Freq: Two times a day (BID) | ORAL | 0 refills | Status: AC

## 2023-01-25 MED ORDER — BUPIVACAINE HCL (PF) 0.5 % IJ SOLN
INTRAMUSCULAR | Status: AC
  Filled 2023-01-25: qty 30

## 2023-01-25 MED ORDER — GABAPENTIN 300 MG PO CAPS: 300.0000 mg | ORAL_CAPSULE | Freq: Every day | ORAL | 0 refills | Status: AC

## 2023-01-25 MED ORDER — MIDAZOLAM HCL 2 MG/2ML IJ SOLN
INTRAMUSCULAR | Status: AC
  Filled 2023-01-25: qty 2

## 2023-01-25 MED ORDER — DEXAMETHASONE SODIUM PHOSPHATE 10 MG/ML IJ SOLN
INTRAMUSCULAR | Status: DC | PRN
  Administered 2023-01-25: 10 mg via INTRAVENOUS

## 2023-01-25 MED ORDER — FENTANYL CITRATE (PF) 100 MCG/2ML IJ SOLN
INTRAMUSCULAR | Status: DC | PRN
  Administered 2023-01-25: 100 ug via INTRAVENOUS

## 2023-01-25 MED ORDER — ONDANSETRON HCL 4 MG/2ML IJ SOLN
INTRAMUSCULAR | Status: AC
  Filled 2023-01-25: qty 2

## 2023-01-25 MED ORDER — OXYCODONE HCL 5 MG PO TABS
5.0000 mg | ORAL_TABLET | Freq: Once | ORAL | Status: AC | PRN
  Administered 2023-01-25: 5 mg via ORAL

## 2023-01-25 MED ORDER — GABAPENTIN 300 MG PO CAPS
300.0000 mg | ORAL_CAPSULE | ORAL | Status: AC
  Administered 2023-01-25: 300 mg via ORAL

## 2023-01-25 MED ORDER — LIDOCAINE HCL (CARDIAC) PF 100 MG/5ML IV SOSY
PREFILLED_SYRINGE | INTRAVENOUS | Status: DC | PRN
  Administered 2023-01-25: 80 mg via INTRAVENOUS

## 2023-01-25 MED ORDER — GABAPENTIN 300 MG PO CAPS
ORAL_CAPSULE | ORAL | Status: AC
  Filled 2023-01-25: qty 1

## 2023-01-25 MED ORDER — PROPOFOL 10 MG/ML IV BOLUS
INTRAVENOUS | Status: DC | PRN
  Administered 2023-01-25: 200 ug/kg/min via INTRAVENOUS

## 2023-01-25 SURGICAL SUPPLY — 59 items
APPLICATOR ARISTA FLEXITIP XL (MISCELLANEOUS) IMPLANT
BAG URINE DRAIN 2000ML AR STRL (UROLOGICAL SUPPLIES) ×2 IMPLANT
BLADE SURG SZ11 CARB STEEL (BLADE) ×2 IMPLANT
CANNULA CAP OBTURATR AIRSEAL 8 (CAP) ×2 IMPLANT
CATH ROBINSON RED A/P 16FR (CATHETERS) ×2 IMPLANT
CATH URTH 16FR FL 2W BLN LF (CATHETERS) ×2 IMPLANT
COUNTER NEEDLE 20/40 LG (NEEDLE) ×2 IMPLANT
COVER TIP SHEARS 8 DVNC (MISCELLANEOUS) ×2 IMPLANT
DERMABOND ADVANCED .7 DNX12 (GAUZE/BANDAGES/DRESSINGS) ×2 IMPLANT
DRAPE ARM DVNC X/XI (DISPOSABLE) ×8 IMPLANT
DRAPE COLUMN DVNC XI (DISPOSABLE) ×2 IMPLANT
DRAPE SHEET LG 3/4 BI-LAMINATE (DRAPES) ×2 IMPLANT
DRIVER NDL MEGA 8 DVNC XI (INSTRUMENTS) ×2 IMPLANT
DRIVER NDLE MEGA DVNC XI (INSTRUMENTS) ×2 IMPLANT
ELECT REM PT RETURN 9FT ADLT (ELECTROSURGICAL) ×2
ELECTRODE REM PT RTRN 9FT ADLT (ELECTROSURGICAL) ×2 IMPLANT
FORCEPS BPLR FENES DVNC XI (FORCEP) ×2 IMPLANT
GAUZE 4X4 16PLY ~~LOC~~+RFID DBL (SPONGE) ×2 IMPLANT
GLOVE BIO SURGEON STRL SZ7 (GLOVE) ×8 IMPLANT
GLOVE BIOGEL PI IND STRL 7.5 (GLOVE) ×8 IMPLANT
GLOVE INDICATOR 7.5 STRL GRN (GLOVE) ×2 IMPLANT
GOWN STRL REUS W/ TWL LRG LVL3 (GOWN DISPOSABLE) ×12 IMPLANT
HEMOSTAT ARISTA ABSORB 3G PWDR (HEMOSTASIS) IMPLANT
IRRIGATION STRYKERFLOW (MISCELLANEOUS) IMPLANT
IRRIGATOR STRYKERFLOW (MISCELLANEOUS)
IV LACTATED RINGERS 1000ML (IV SOLUTION) ×2 IMPLANT
IV NS 1000ML BAXH (IV SOLUTION) IMPLANT
KIT PINK PAD W/HEAD ARE REST (MISCELLANEOUS) ×2
KIT PINK PAD W/HEAD ARM REST (MISCELLANEOUS) ×2 IMPLANT
LABEL OR SOLS (LABEL) ×2 IMPLANT
MANIFOLD NEPTUNE II (INSTRUMENTS) ×2 IMPLANT
MANIPULATOR VCARE LG CRV RETR (MISCELLANEOUS) IMPLANT
MANIPULATOR VCARE SML CRV RETR (MISCELLANEOUS) IMPLANT
MANIPULATOR VCARE STD CRV RETR (MISCELLANEOUS) IMPLANT
NS IRRIG 1000ML POUR BTL (IV SOLUTION) ×2 IMPLANT
NS IRRIG 500ML POUR BTL (IV SOLUTION) ×2 IMPLANT
OBTURATOR OPTICAL STND 8 DVNC (TROCAR) ×2
OBTURATOR OPTICALSTD 8 DVNC (TROCAR) ×2 IMPLANT
OCCLUDER COLPOPNEUMO (BALLOONS) ×2 IMPLANT
PACK GYN LAPAROSCOPIC (MISCELLANEOUS) ×2 IMPLANT
PAD OB MATERNITY 11 LF (PERSONAL CARE ITEMS) ×2 IMPLANT
PAD PREP OB/GYN DISP 24X41 (PERSONAL CARE ITEMS) ×2 IMPLANT
SCISSORS MNPLR CVD DVNC XI (INSTRUMENTS) ×2 IMPLANT
SCRUB CHG 4% DYNA-HEX 4OZ (MISCELLANEOUS) ×2 IMPLANT
SEAL UNIV 5-12 XI (MISCELLANEOUS) ×6 IMPLANT
SEALER VESSEL EXT DVNC XI (MISCELLANEOUS) ×2 IMPLANT
SET CYSTO W/LG BORE CLAMP LF (SET/KITS/TRAYS/PACK) ×2 IMPLANT
SET TUBE FILTERED XL AIRSEAL (SET/KITS/TRAYS/PACK) ×2 IMPLANT
SOL ELECTROSURG ANTI STICK (MISCELLANEOUS) ×2
SOL PREP PVP 2OZ (MISCELLANEOUS) ×2
SOLUTION ELECTROSURG ANTI STCK (MISCELLANEOUS) ×2 IMPLANT
SOLUTION PREP PVP 2OZ (MISCELLANEOUS) ×2 IMPLANT
SURGILUBE 2OZ TUBE FLIPTOP (MISCELLANEOUS) ×2 IMPLANT
SUT MNCRL 4-0 27XMFL (SUTURE) ×2
SUT STRATA 2-0 30 CT-2 (SUTURE) ×2 IMPLANT
SUT VIC AB 0 CT2 27 (SUTURE) ×4 IMPLANT
SUTURE MNCRL 4-0 27XMF (SUTURE) ×2 IMPLANT
TRAP FLUID SMOKE EVACUATOR (MISCELLANEOUS) ×2 IMPLANT
WATER STERILE IRR 500ML POUR (IV SOLUTION) ×2 IMPLANT

## 2023-01-25 NOTE — Anesthesia Postprocedure Evaluation (Signed)
Anesthesia Post Note  Patient: Victoria Shelton  Procedure(s) Performed: XI ROBOTIC ASSISTED TOTAL HYSTERECTOMY WITH BILATERAL SALPINGO OOPHORECTOMY (Bilateral) CYSTOSCOPY  Patient location during evaluation: PACU Anesthesia Type: General Level of consciousness: awake and alert Pain management: pain level controlled Vital Signs Assessment: post-procedure vital signs reviewed and stable Respiratory status: spontaneous breathing, nonlabored ventilation, respiratory function stable and patient connected to nasal cannula oxygen Cardiovascular status: blood pressure returned to baseline and stable Postop Assessment: no apparent nausea or vomiting Anesthetic complications: no   There were no known notable events for this encounter.   Last Vitals:  Vitals:   01/25/23 1015 01/25/23 1035  BP: 110/73 126/75  Pulse: 65 (!) 53  Resp: 20 18  Temp: (!) 36.1 C (!) 36.3 C  SpO2: 97% 98%    Last Pain:  Vitals:   01/25/23 1035  TempSrc: Temporal  PainSc: 5                  Corinda Gubler

## 2023-01-25 NOTE — Anesthesia Procedure Notes (Signed)
Procedure Name: Intubation Date/Time: 01/25/2023 7:46 AM  Performed by: Lysbeth Penner, CRNAPre-anesthesia Checklist: Patient identified, Emergency Drugs available, Suction available and Patient being monitored Patient Re-evaluated:Patient Re-evaluated prior to induction Oxygen Delivery Method: Circle system utilized Preoxygenation: Pre-oxygenation with 100% oxygen Induction Type: IV induction Ventilation: Mask ventilation without difficulty Laryngoscope Size: McGrath and 3 Grade View: Grade I Tube type: Oral Tube size: 6.5 mm Number of attempts: 1 Airway Equipment and Method: Stylet and Oral airway Placement Confirmation: ETT inserted through vocal cords under direct vision, positive ETCO2 and breath sounds checked- equal and bilateral Secured at: 20 cm Tube secured with: Tape Dental Injury: Teeth and Oropharynx as per pre-operative assessment

## 2023-01-25 NOTE — Anesthesia Preprocedure Evaluation (Signed)
Anesthesia Evaluation  Patient identified by MRN, date of birth, ID band Patient awake    Reviewed: Allergy & Precautions, NPO status , Patient's Chart, lab work & pertinent test results  History of Anesthesia Complications Negative for: history of anesthetic complications  Airway Mallampati: II  TM Distance: >3 FB Neck ROM: Full    Dental no notable dental hx. (+) Teeth Intact   Pulmonary neg pulmonary ROS, neg sleep apnea, neg COPD, Patient abstained from smoking.Not current smoker   Pulmonary exam normal breath sounds clear to auscultation       Cardiovascular Exercise Tolerance: Good METShypertension, Pt. on medications (-) CAD and (-) Past MI (-) dysrhythmias  Rhythm:Regular Rate:Normal - Systolic murmurs    Neuro/Psych negative neurological ROS  negative psych ROS   GI/Hepatic PUD,neg GERD  ,,(+)     (-) substance abuse    Endo/Other  neg diabetes    Renal/GU negative Renal ROS     Musculoskeletal   Abdominal   Peds  Hematology   Anesthesia Other Findings Past Medical History: No date: Borderline hypertension No date: Fibrocystic breast disease, left No date: Hyperlipemia No date: Hypertension No date: Menopause No date: PMB (postmenopausal bleeding) No date: Rotator cuff syndrome No date: Squamous cell carcinoma No date: Ulcerative colitis (HCC)  Reproductive/Obstetrics                             Anesthesia Physical Anesthesia Plan  ASA: 2  Anesthesia Plan: General   Post-op Pain Management: Gabapentin PO (pre-op)*, Tylenol PO (pre-op)* and Toradol IV (intra-op)*   Induction: Intravenous  PONV Risk Score and Plan: 4 or greater and Ondansetron, Dexamethasone, Midazolam, TIVA and Propofol infusion  Airway Management Planned: Oral ETT  Additional Equipment: None  Intra-op Plan:   Post-operative Plan: Extubation in OR  Informed Consent: I have reviewed the  patients History and Physical, chart, labs and discussed the procedure including the risks, benefits and alternatives for the proposed anesthesia with the patient or authorized representative who has indicated his/her understanding and acceptance.     Dental advisory given  Plan Discussed with: CRNA and Surgeon  Anesthesia Plan Comments: (Discussed risks of anesthesia with patient, including PONV, sore throat, lip/dental/eye damage. Rare risks discussed as well, such as cardiorespiratory and neurological sequelae, and allergic reactions. Discussed the role of CRNA in patient's perioperative care. Patient understands.)       Anesthesia Quick Evaluation

## 2023-01-25 NOTE — Discharge Instructions (Signed)
Discharge instructions after  robotically-assisted total laparoscopic hysterectomy   For the next three days, take ibuprofen and acetaminophen on a schedule, every 8 hours. You can take them together or you can intersperse them, and take one every four hours. I also gave you gabapentin for nighttime, to help you sleep and also to control pain. Take gabapentin medicines at night for at least the next 3 nights. You also have a narcotic, oxycodone, to take as needed if the above medicines don't help.  Postop constipation is a major cause of pain. Stay well hydrated, walk as you tolerate, and take over the counter senna as well as stool softeners if you need them.   Signs and Symptoms to Report Call our office at [phone] if you have any of the following.   Fever over 100.4 degrees or higher  Severe stomach pain not relieved with pain medications  Bright red bleeding that's heavier than a period that does not slow with rest  To go the bathroom a lot (frequency), you can't hold your urine (urgency), or it hurts when you empty your bladder (urinate)  Chest pain  Shortness of breath  Pain in the calves of your legs  Severe nausea and vomiting not relieved with anti-nausea medications  Signs of infection around your wounds, such as redness, hot to touch, swelling, green/yellow drainage (like pus), bad smelling discharge  Any concerns  What You Can Expect after Surgery  You may see some pink tinged, bloody fluid and bruising around the wound. This is normal.  You may notice shoulder and neck pain. This is caused by the gas used during surgery to expand your abdomen so your surgeon could get to the uterus easier.  You may have a sore throat because of the tube in your mouth during general anesthesia. This will go away in 2 to 3 days.  You may have some stomach cramps.  You may notice spotting on your panties.  You may have pain around the incision sites.   Activities after Your  Discharge Follow these guidelines to help speed your recovery at home:  Do the coughing and deep breathing as you did in the hospital for 2 weeks. Use the small blue breathing device, called the incentive spirometer for 2 weeks.  Don't drive if you are in pain or taking narcotic pain medicine. You may drive when you can safely slam on the brakes, turn the wheel forcefully, and rotate your torso comfortably. This is typically 1-2 weeks. Practice in a parking lot or side street prior to attempting to drive regularly.   Ask others to help with household chores for 4 weeks.  Do not lift anything heavier that 10 pounds for 4-6 weeks. This includes pets, children, and groceries.  Don't do strenuous activities, exercises, or sports like vacuuming, tennis, squash, etc. until your doctor says it is safe to do so. ---Maintain pelvic rest for 12 weeks. This means nothing in the vagina or rectum at all (no douching, tampons, intercourse) for 12 weeks.   Walk as you feel able. Rest often since it may take two or three weeks for your energy level to return to normal.   You may climb stairs  Avoid constipation:   -Eat fruits, vegetables, and whole grains. Eat small meals as your appetite will take time to return to normal.   -Drink 6 to 8 glasses of water each day unless your doctor has told you to limit your fluids.   -Use a laxative or  stool softener as needed if constipation becomes a problem. You may take Miralax, metamucil, Citrucil, Colace, Senekot, FiberCon, etc. If this does not relieve the constipation, try two tablespoons of Milk Of Magnesia every 8 hours until your bowels move.   You may shower. Gently wash the wounds with a mild soap and water. Pat dry.  Do not get in a hot tub, swimming pool, etc. for 6 weeks.  Do not use lotions, oils, powders on the wounds.  Do not douche, use tampons, or have sex until your doctor says it is okay.  Take your pain medicine when you need it. The medicine may not  work as well if the pain is bad.  Take the medicines you were taking before surgery. Other medications you will need are pain medications (Norco or Percocet) and nausea medications (Zofran).

## 2023-01-25 NOTE — Interval H&P Note (Signed)
History and Physical Interval Note:  01/25/2023 7:33 AM  Victoria Shelton  has presented today for surgery, with the diagnosis of postmenopausal bleeding.  The various methods of treatment have been discussed with the patient and family. After consideration of risks, benefits and other options for treatment, the patient has consented to  Procedure(s): XI ROBOTIC ASSISTED TOTAL HYSTERECTOMY WITH BILATERAL SALPINGO OOPHORECTOMY (Bilateral) CYSTOSCOPY (N/A) as a surgical intervention.  The patient's history has been reviewed, patient examined, no change in status, stable for surgery.  I have reviewed the patient's chart and labs.  Questions were answered to the patient's satisfaction.     [name]

## 2023-01-25 NOTE — Transfer of Care (Signed)
Immediate Anesthesia Transfer of Care Note  Patient: Victoria Shelton  Procedure(s) Performed: XI ROBOTIC ASSISTED TOTAL HYSTERECTOMY WITH BILATERAL SALPINGO OOPHORECTOMY (Bilateral) CYSTOSCOPY  Patient Location: PACU  Anesthesia Type:General  Level of Consciousness: sedated  Airway & Oxygen Therapy: Patient Spontanous Breathing  Post-op Assessment: Report given to RN and Post -op Vital signs reviewed and stable  Post vital signs: Reviewed and stable  Last Vitals:  Vitals Value Taken Time  BP 111/74 01/25/23 0947  Temp 36.3 C 01/25/23 0945  Pulse 63 01/25/23 0954  Resp 16 01/25/23 0954  SpO2 95 % 01/25/23 0954  Vitals shown include unfiled device data.  Last Pain:  Vitals:   01/25/23 0945  TempSrc:   PainSc: Asleep         Complications: There were no known notable events for this encounter.

## 2023-01-25 NOTE — Op Note (Signed)
Victoria Shelton PROCEDURE DATE: 01/25/2023  PREOPERATIVE DIAGNOSIS: Postmenopausal bleeding, persistent. POSTOPERATIVE DIAGNOSIS: The same PROCEDURE:  XI ROBOTIC ASSISTED TOTAL HYSTERECTOMY WITH BILATERAL SALPINGO OOPHORECTOMY:  CYSTOSCOPY: 52000 (CPT)  SURGEON:  Dr. [name], MD ASSISTANT: RNFA Anesthesiologist:  Anesthesiologist: Corinda Gubler, MD CRNA: Katherine Basset, CRNA; Lysbeth Penner, CRNA  INDICATIONS: 67 y.o. F  here for definitive surgical management secondary to the indications listed under preoperative diagnoses; please see preoperative note for further details.  Risks of surgery were discussed with the patient including but not limited to: bleeding which may require transfusion or reoperation; infection which may require antibiotics; injury to bowel, bladder, ureters or other surrounding organs; need for additional procedures; thromboembolic phenomenon, incisional problems and other postoperative/anesthesia complications. Written informed consent was obtained.    FINDINGS:    External genitalia, vaginal canal and cervix negative for lesions. Intraoperative findings revealed a normal upper abdomen including bowel, liver, diaphragmatic surfaces, stomach, and omentum.   The uterus was small and mobile.  The right and left ovaries appeared normal.  Bilateral tubes appeared normal.  Appendix was adherent to the right pelvic sidewall, and there was significant scar tissue in the right lower quadrant.  This was freed up to allow safe access to the IP ligament.   ANESTHESIA:    General INTRAVENOUS FLUIDS:see anesthesia's report ESTIMATED BLOOD LOSS:minimal URINE OUTPUT: 600 ml  SPECIMENS: Uterus, cervix, bilateral fallopian tubes and bilateral ovaries COMPLICATIONS: None immediate   RATLH/BS:   PROCEDURE IN DETAIL: After informed consent was obtained, the patient was taken to the operating room where general anesthesia was obtained without difficulty. The  patient was positioned in the dorsal lithotomy position in [place] stirrups and her arms were carefully tucked at her sides and the usual precautions were taken. Deep Trendelenburg (20-25 deg) was established to confirm that she does not shift on the table.  She was prepped and draped in normal sterile fashion.  Time-out was performed and a Foley catheter was placed into the bladder. A standard VCare uterine manipulator was then placed in the uterus without incident.  Preoperative prophylactic antibiotics were given through her iv.  After infiltration of local anesthetic at the proposed trocar sites, an 8 mm incision was created at the umbilicus, and an AirSeal 5mm was placed under direct visualization, after confirmation of OG tube working well. Pneumoperitoneum was created to a pressure of 15 mm Hg. The camera was placed and the abdomin surveyed, noting intact bowel below the site of entry. A survey of the pelvis and upper abdomen revealed the above findings. One right and one left lateral 8-mm robotic ports were placed under direct visualization.  The patient was placed in deepTrendelenburg and the bowel was displaced up into the upper abdomen. The robot was left side docked. The instruments were placed under direct visualization.   The ureters were identified bilaterally coursing outside of the operative field. Round ligaments were divided on each side with the EndoShears and the retroperitoneal space was opened bilaterally. The posterior leaflet of the broad was taken down to the level of the IP ligament. The anterior leaflet of the broad ligament was carefully taken down to the midline.  A bladder flap was created and the bladder was dissected down off the lower uterine segment and cervix using endoshears and electrocautery.    Ovariolysis was performed and the ovary was dissected medially with care to avoid the ureter.  The infundibulopelvic ligaments were skeletonized, sealed and divided. The  peritoneum was taken down  to the level of the internal os, and the uterine arteries skeletonized. With strong cephalad pressure from the V-care, bipolar cautery was used to seal and transect the uterine arteries, and the pedicles allowed to fall away laterally.  A colpotomy was performed circumferentially along the V-Care ring with monopolar electrocautery and the cervix was incised from the vagina using the laparoscopic scissors. The specimen was removed through the vagina.  A pneumo balloon was placed in the vagina and the vaginal cuff was then closed in a running continuous fashion using the  0 V-Lock suture with careful attention to include the vaginal cuff angles, the uterosacral ligaments and the vaginal mucosa within the closure.  Hemostasis was secured with intraabdominal pressure and review of all surgical sites. The intraperitoneal pressure was dropped, and all planes of dissection, vascular pedicles and the vaginal cuff were found to be hemostatic.  The robot was undocked.   Attention was turned to the bladder and cystoscopy showed vigorous bilateral ureteral jets.  No stitches were visualized in the bladder during cystoscopy.  The lateral trocars were removed under visualization.  The CO2 gas was released and several deep breaths given to remove any remaining CO2 from the peritoneal cavity.  The skin incisions were closed with 4-0 Monocryl subcuticular stitch and Dermabond.    Anesthesia was reversed without difficulty.  The patient tolerated the procedure well.  Sponge, lap and needle counts were correct x2.  The patient was taken to recovery room in excellent condition.

## 2023-01-26 ENCOUNTER — Encounter: Payer: Self-pay | Admitting: Obstetrics and Gynecology

## 2023-02-05 LAB — SURGICAL PATHOLOGY

## 2023-04-11 ENCOUNTER — Encounter (INDEPENDENT_AMBULATORY_CARE_PROVIDER_SITE_OTHER): Payer: BC Managed Care – PPO | Admitting: Ophthalmology

## 2023-04-11 DIAGNOSIS — H35342 Macular cyst, hole, or pseudohole, left eye: Secondary | ICD-10-CM | POA: Diagnosis not present

## 2023-04-11 DIAGNOSIS — I10 Essential (primary) hypertension: Secondary | ICD-10-CM | POA: Diagnosis not present

## 2023-04-11 DIAGNOSIS — H43813 Vitreous degeneration, bilateral: Secondary | ICD-10-CM

## 2023-04-11 DIAGNOSIS — H35033 Hypertensive retinopathy, bilateral: Secondary | ICD-10-CM

## 2023-04-18 ENCOUNTER — Encounter: Payer: Self-pay | Admitting: Radiology

## 2023-04-18 ENCOUNTER — Ambulatory Visit
Admission: RE | Admit: 2023-04-18 | Discharge: 2023-04-18 | Disposition: A | Discharge status: Home / Self Care | Payer: BC Managed Care – PPO | Source: Ambulatory Visit | Attending: Internal Medicine | Admitting: Internal Medicine

## 2023-04-18 DIAGNOSIS — Z1231 Encounter for screening mammogram for malignant neoplasm of breast: Secondary | ICD-10-CM | POA: Insufficient documentation

## 2023-05-20 ENCOUNTER — Other Ambulatory Visit
Admission: RE | Admit: 2023-05-20 | Discharge: 2023-05-20 | Disposition: A | Discharge status: Home / Self Care | Source: Ambulatory Visit | Attending: Family Medicine | Admitting: Family Medicine

## 2023-05-20 ENCOUNTER — Other Ambulatory Visit: Payer: Self-pay | Admitting: Family Medicine

## 2023-05-20 ENCOUNTER — Ambulatory Visit
Admission: RE | Admit: 2023-05-20 | Discharge: 2023-05-20 | Disposition: A | Discharge status: Home / Self Care | Source: Ambulatory Visit | Attending: Family Medicine | Admitting: Family Medicine

## 2023-05-20 DIAGNOSIS — M79605 Pain in left leg: Secondary | ICD-10-CM | POA: Diagnosis present

## 2023-05-20 DIAGNOSIS — M79604 Pain in right leg: Secondary | ICD-10-CM | POA: Insufficient documentation

## 2023-05-20 DIAGNOSIS — M7989 Other specified soft tissue disorders: Secondary | ICD-10-CM

## 2023-05-20 LAB — CBC WITH DIFFERENTIAL/PLATELET
Abs Immature Granulocytes: 0.05 10*3/uL (ref 0.00–0.07)
Basophils Absolute: 0.1 10*3/uL (ref 0.0–0.1)
Basophils Relative: 1 %
Eosinophils Absolute: 0.2 10*3/uL (ref 0.0–0.5)
Eosinophils Relative: 2 %
HCT: 37.3 % (ref 36.0–46.0)
Hemoglobin: 12.8 g/dL (ref 12.0–15.0)
Immature Granulocytes: 1 %
Lymphocytes Relative: 62 %
Lymphs Abs: 6.3 10*3/uL — ABNORMAL HIGH (ref 0.7–4.0)
MCH: 31 pg (ref 26.0–34.0)
MCHC: 34.3 g/dL (ref 30.0–36.0)
MCV: 90.3 fL (ref 80.0–100.0)
Monocytes Absolute: 0.7 10*3/uL (ref 0.1–1.0)
Monocytes Relative: 7 %
Neutro Abs: 2.7 10*3/uL (ref 1.7–7.7)
Neutrophils Relative %: 27 %
Platelets: 246 10*3/uL (ref 150–400)
RBC: 4.13 MIL/uL (ref 3.87–5.11)
RDW: 13.7 % (ref 11.5–15.5)
Smear Review: NORMAL
WBC: 10 10*3/uL (ref 4.0–10.5)
nRBC: 0 % (ref 0.0–0.2)

## 2023-05-20 LAB — CBC
HCT: 37.2 % (ref 36.0–46.0)
Hemoglobin: 12.8 g/dL (ref 12.0–15.0)
MCH: 31.1 pg (ref 26.0–34.0)
MCHC: 34.4 g/dL (ref 30.0–36.0)
MCV: 90.3 fL (ref 80.0–100.0)
Platelets: 240 10*3/uL (ref 150–400)
RBC: 4.12 MIL/uL (ref 3.87–5.11)
RDW: 13.5 % (ref 11.5–15.5)
WBC: 10 10*3/uL (ref 4.0–10.5)
nRBC: 0.3 % — ABNORMAL HIGH (ref 0.0–0.2)

## 2024-04-09 ENCOUNTER — Encounter (INDEPENDENT_AMBULATORY_CARE_PROVIDER_SITE_OTHER): Admitting: Ophthalmology
